# Patient Record
Sex: Female | Born: 1975
Health system: Southern US, Community
[De-identification: ages and names within clinical notes are randomized; demographics above are authoritative.]

## PROBLEM LIST (undated history)

## (undated) ENCOUNTER — Emergency Department (HOSPITAL_COMMUNITY): Admission: EM | Payer: BLUE CROSS/BLUE SHIELD

## (undated) DIAGNOSIS — I10 Essential (primary) hypertension: Secondary | ICD-10-CM

---

## 1999-08-29 ENCOUNTER — Inpatient Hospital Stay (HOSPITAL_COMMUNITY): Admission: AD | Admit: 1999-08-29 | Discharge: 1999-08-29 | Payer: Self-pay | Admitting: *Deleted

## 1999-10-02 ENCOUNTER — Inpatient Hospital Stay (HOSPITAL_COMMUNITY): Admission: AD | Admit: 1999-10-02 | Discharge: 1999-10-02 | Payer: Self-pay | Admitting: *Deleted

## 1999-10-03 ENCOUNTER — Encounter: Payer: Self-pay | Admitting: *Deleted

## 2009-10-13 ENCOUNTER — Emergency Department (HOSPITAL_COMMUNITY): Admission: EM | Admit: 2009-10-13 | Discharge: 2009-10-13 | Payer: Self-pay | Admitting: Emergency Medicine

## 2010-07-26 ENCOUNTER — Ambulatory Visit (INDEPENDENT_AMBULATORY_CARE_PROVIDER_SITE_OTHER): Payer: BC Managed Care – PPO

## 2010-07-26 ENCOUNTER — Encounter (HOSPITAL_COMMUNITY): Payer: Self-pay

## 2010-07-26 ENCOUNTER — Inpatient Hospital Stay (INDEPENDENT_AMBULATORY_CARE_PROVIDER_SITE_OTHER)
Admission: RE | Admit: 2010-07-26 | Discharge: 2010-07-26 | Disposition: A | Discharge status: Home / Self Care | Payer: BC Managed Care – PPO | Source: Ambulatory Visit | Attending: Emergency Medicine | Admitting: Emergency Medicine

## 2010-07-26 DIAGNOSIS — K5900 Constipation, unspecified: Secondary | ICD-10-CM

## 2010-07-26 LAB — POCT I-STAT, CHEM 8
BUN: 6 mg/dL (ref 6–23)
Calcium, Ion: 1.2 mmol/L (ref 1.12–1.32)
Chloride: 106 mEq/L (ref 96–112)
Creatinine, Ser: 0.7 mg/dL (ref 0.50–1.10)
Glucose, Bld: 92 mg/dL (ref 70–99)
HCT: 40 % (ref 36.0–46.0)
Hemoglobin: 13.6 g/dL (ref 12.0–15.0)
Potassium: 3.9 mEq/L (ref 3.5–5.1)
Sodium: 138 mEq/L (ref 135–145)
TCO2: 25 mmol/L (ref 0–100)

## 2010-07-26 LAB — POCT URINALYSIS DIP (DEVICE)
Glucose, UA: NEGATIVE mg/dL
Ketones, ur: NEGATIVE mg/dL
Leukocytes, UA: NEGATIVE
Nitrite: NEGATIVE
Protein, ur: NEGATIVE mg/dL
Specific Gravity, Urine: >=1.03 (ref 1.005–1.030)
Urobilinogen, UA: 1 mg/dL (ref 0.0–1.0)
pH: 5.5 (ref 5.0–8.0)

## 2010-07-26 LAB — OCCULT BLOOD, POC DEVICE: Fecal Occult Bld: NEGATIVE

## 2010-07-26 LAB — POCT PREGNANCY, URINE: Preg Test, Ur: NEGATIVE

## 2011-05-20 ENCOUNTER — Encounter: Payer: Self-pay | Admitting: *Deleted

## 2011-05-20 ENCOUNTER — Ambulatory Visit (INDEPENDENT_AMBULATORY_CARE_PROVIDER_SITE_OTHER): Payer: BC Managed Care – PPO | Admitting: Cardiovascular Disease

## 2011-05-20 VITALS — BP 143/87 | HR 62 | Ht 66.0 in | Wt 163.0 lb

## 2011-05-20 DIAGNOSIS — I493 Ventricular premature depolarization: Secondary | ICD-10-CM | POA: Insufficient documentation

## 2011-05-20 DIAGNOSIS — I4949 Other premature depolarization: Secondary | ICD-10-CM

## 2011-05-20 DIAGNOSIS — R011 Cardiac murmur, unspecified: Secondary | ICD-10-CM

## 2011-05-20 NOTE — Assessment & Plan Note (Signed)
Victoria Young is a healthy 36 year old female who was seen by her GYN doctor yesterday. She was noted to have a heart murmur. There is also question of ventricular arrhythmias.  [name] does have a normal systolic flow murmur. She does not have any significant diastolic murmur. She is normal splitting of the second heart sound.  I do not hear any arrhythmias on exam. She did have one premature ventricular contraction seen on the EKG.  The patient is completely asymptomatic. She exercises on an intermittent basis. She's able to run one or 2 miles without difficulty. I think that she is at low risk for upcoming GYN surgery.    I would be happy to see if she has any additional problems. I'll see her on an as-needed basis.

## 2011-05-20 NOTE — Progress Notes (Signed)
    Victoria Young Date of Birth  05/31/1975       [facility]    [place] [address]  [address] [place], [place]  [address]   [place], [place]  [address] [phone]     [phone]   Fax  [phone]    Fax [phone]  Problem List: 1. Heart murmur 2. Premature ventricular contractions  History of Present Illness:  Victoria Young is a 36 yo who is referred for evaulation of a heart murmur.  She's never had any significant episodes of chest pain. She has occasional episodes of dyspnea with exertion.  She still exercises occasionally. She's overrun about a mile and a half without any significant problems. She does notice some shortness of breath if she runs up the stairs very quickly.  She denies any syncope or presyncope.   she was at her OB/GYN doctor yesterday. She is scheduled have an outpatient procedure tomorrow. She was found to have a soft murmur and was referred here for further evaluation.  No current outpatient prescriptions on file prior to visit.    No Known Allergies  History reviewed. No pertinent past medical history.  Past Surgical History  Procedure Date  . Cesarean section     x2    History  Smoking status  . Never Smoker   Smokeless tobacco  . Not on file    History  Alcohol Use  . Yes    Occas.    Family History  Problem Relation Age of Onset  . Hypertension Mother   . Hypertension Father     Reviw of Systems:  Reviewed in the HPI.  All other systems are negative.  Physical Exam: Blood pressure 143/87, pulse 62, height 5\' 6"  (1.676 m), weight 163 lb (73.936 kg). General: Well developed, well nourished, in no acute distress.  Head: Normocephalic, atraumatic, sclera non-icteric, mucus membranes are moist,   Neck: Supple. Carotids are 2 + without bruits. No JVD  Lungs: Clear bilaterally to auscultation.  Heart: regular rate.  normal  S1 S2. She has a very soft systolic murmur at the LSB.   There is no significant diastolic murmur.  She has normal splitting of the S2.  Abdomen: Soft, non-tender, non-distended with normal bowel sounds. No hepatomegaly. No rebound/guarding. No masses.  Msk:  Strength and tone are normal  Extremities: No clubbing or cyanosis. No edema.  Distal pedal pulses are 2+ and equal bilaterally.  Neuro: Alert and oriented X 3. Moves all extremities spontaneously.  Psych:  Responds to questions appropriately with a normal affect.  ECG: May 20, 2011-sinus rhythm at 64 beats a minute. She has occasional premature ventricular contractions.  Assessment / Plan:

## 2011-05-20 NOTE — Patient Instructions (Signed)
Your physician recommends that you schedule a follow-up appointment in: PRN  PT AT LOW CARDIAC RISK FOR UPCOMING SURGERY

## 2012-02-21 ENCOUNTER — Emergency Department (HOSPITAL_COMMUNITY)
Admission: EM | Admit: 2012-02-21 | Discharge: 2012-02-21 | Disposition: A | Discharge status: Home / Self Care | Payer: BC Managed Care – PPO | Source: Home / Self Care | Attending: Emergency Medicine | Admitting: Emergency Medicine

## 2012-02-21 ENCOUNTER — Encounter (HOSPITAL_COMMUNITY): Payer: Self-pay | Admitting: Emergency Medicine

## 2012-02-21 ENCOUNTER — Other Ambulatory Visit: Payer: Self-pay

## 2012-02-21 DIAGNOSIS — F411 Generalized anxiety disorder: Secondary | ICD-10-CM

## 2012-02-21 DIAGNOSIS — F419 Anxiety disorder, unspecified: Secondary | ICD-10-CM

## 2012-02-21 DIAGNOSIS — I10 Essential (primary) hypertension: Secondary | ICD-10-CM

## 2012-02-21 HISTORY — DX: Essential (primary) hypertension: I10

## 2012-02-21 LAB — POCT PREGNANCY, URINE: Preg Test, Ur: NEGATIVE

## 2012-02-21 MED ORDER — HYDROCHLOROTHIAZIDE 12.5 MG PO TABS: 12.5000 mg | ORAL_TABLET | Freq: Every day | ORAL | Status: DC

## 2012-02-21 MED ORDER — CLONAZEPAM 0.5 MG PO TABS: 0.5000 mg | ORAL_TABLET | Freq: Two times a day (BID) | ORAL | Status: DC | PRN

## 2012-02-21 NOTE — ED Provider Notes (Signed)
Chief Complaint  Patient presents with  . Chest Pain    History of Present Illness:   Victoria Young is a 37 year old female who has had a two-week history of intermittent shortness of breath both with rest and exertion and a slight dry cough. She denies any wheezing or asthma history. Today she had some substernal chest pressure. This was not a pain and it did not radiate. She did not have any shortness of breath today and denies any diaphoresis or nausea. The past week she's had numbness of the right corner of the mouth it comes and goes and can last seconds at a time. She denies any other neurological symptoms. For about the past 2 weeks she's had daily headaches in the morning for which she takes daily Tylenol or Advil. Sometimes her heart races and skips. This is been going on for a long time. She saw a cardiologist at [facility] in [place] last summer and also a cardiologist at [facility], Dr. Eden Emms, both of them did EKGs and reassured her that everything looked okay. She had an echocardiogram in [place] which she was told was normal. She never had a stress test. Her only risk factor is high blood pressure which is controlled with diet. He denies any blurry vision, history of diabetes, other neurological symptoms, history of hyperlipidemia, or cigarette smoking. She states she's been stressed out by some weight gain even though she was trying to lose. She denies any depression. She states she does feel somewhat anxious.  Review of Systems:  Other than noted above, the patient denies any of the following symptoms. Systemic:  No fever, chills, sweats, or fatigue. ENT:  No nasal congestion, rhinorrhea, or sore throat. Pulmonary:  No cough, wheezing, shortness of breath, sputum production, hemoptysis. Cardiac:  No palpitations, rapid heartbeat, dizziness, presyncope or syncope. GI:  No abdominal pain, heartburn, nausea, or vomiting. Ext:  No leg pain or swelling.  PMFSH:  Past  medical history, family history, social history, meds, and allergies were reviewed and updated as needed.   Physical Exam:   Vital signs:  BP 144/102  Pulse 75  Temp(Src) 97.9 F (36.6 C) (Oral)  Resp 18  SpO2 100% Gen:  Alert, oriented, in no distress, skin warm and dry. Eye:  PERRL, lids and conjunctivas normal.  Sclera non-icteric. ENT:  Mucous membranes moist, pharynx clear. Neck:  Supple, no adenopathy or tenderness.  No JVD. Lungs:  Clear to auscultation, no wheezes, rales or rhonchi.  No respiratory distress. Heart:  Regular rhythm.  No gallops, murmers, clicks or rubs. Chest:  No chest wall tenderness. Abdomen:  Soft, nontender, no organomegaly or mass.  Bowel sounds normal.  No pulsatile abdominal mass or bruit. Ext:  No edema.  No calf tenderness and Homann's sign negative.  Pulses full and equal. Skin:  Warm and dry.  No rash.  Labs:   Results for orders placed during the hospital encounter of 02/21/12  POCT PREGNANCY, URINE      Result Value Range   Preg Test, Ur NEGATIVE  NEGATIVE     Radiology:  No results found. I reviewed the images independently and personally and concur with the radiologist's findings.  EKG:   Date: 02/21/2012  Rate: 64  Rhythm: normal sinus rhythm  QRS Axis: normal  Intervals: normal  ST/T Wave abnormalities: normal  Conduction Disutrbances:none  Narrative Interpretation: Normal sinus rhythm, left ventricular hypertrophy, otherwise normal EKG.  Old EKG Reviewed: none available  Assessment:  The  primary encounter diagnosis was Anxiety. A diagnosis of Hypertension was also pertinent to this visit.  I do not see any evidence of a cardiac cause for her symptoms. Shortness of breath, chest pressure, perioral numbness, headache, and palpitations all tender point towards anxiety. For right now would try clonazepam. Since her blood pressure was elevated today I went ahead and started on HydroDIURIL. I urged her to followup with her primary care  physician within the next week.  Plan:   1.  The following meds were prescribed:   Discharge Medication List as of 02/21/2012  1:32 PM    START taking these medications   Details  clonazePAM (KLONOPIN) 0.5 MG tablet Take 1 tablet (0.5 mg total) by mouth 2 (two) times daily as needed for anxiety., Starting 02/21/2012, Until Discontinued, Print    hydrochlorothiazide (HYDRODIURIL) 12.5 MG tablet Take 1 tablet (12.5 mg total) by mouth daily., Starting 02/21/2012, Until Discontinued, Normal       2.  The patient was instructed in symptomatic care and handouts were given. 3.  The patient was told to return if becoming worse in any way, if no better in 3 or 4 days, and given some red flag symptoms that would indicate earlier return.    Reuben Likes, MD 02/21/12 2129

## 2012-02-21 NOTE — ED Notes (Signed)
Dr. Lorenz Coaster has been adv of abd vitals and sx.

## 2012-02-21 NOTE — ED Notes (Signed)
Pt is here for chest pain since 0900 Sx include: severe headaches, numbness of right side of face that lasts for about 10 sec, SOB Denies: f/v/n/d/blurry vision, edema Hx of HTN; has not been taking her BP meds; controlling it w/diet and exercise BP today was 144/102  She is alert and talking in complete sentences w/no signs of acute distress.

## 2012-03-24 ENCOUNTER — Emergency Department (HOSPITAL_COMMUNITY)
Admission: EM | Admit: 2012-03-24 | Discharge: 2012-03-24 | Disposition: A | Discharge status: Home / Self Care | Payer: BC Managed Care – PPO | Attending: Emergency Medicine | Admitting: Emergency Medicine

## 2012-03-24 ENCOUNTER — Encounter (HOSPITAL_COMMUNITY): Payer: Self-pay | Admitting: Emergency Medicine

## 2012-03-24 DIAGNOSIS — R0789 Other chest pain: Secondary | ICD-10-CM | POA: Insufficient documentation

## 2012-03-24 DIAGNOSIS — R350 Frequency of micturition: Secondary | ICD-10-CM | POA: Insufficient documentation

## 2012-03-24 DIAGNOSIS — Z79899 Other long term (current) drug therapy: Secondary | ICD-10-CM | POA: Insufficient documentation

## 2012-03-24 DIAGNOSIS — I10 Essential (primary) hypertension: Secondary | ICD-10-CM | POA: Insufficient documentation

## 2012-03-24 DIAGNOSIS — R0602 Shortness of breath: Secondary | ICD-10-CM | POA: Insufficient documentation

## 2012-03-24 DIAGNOSIS — R079 Chest pain, unspecified: Secondary | ICD-10-CM

## 2012-03-24 LAB — CBC
HCT: 32.9 % — ABNORMAL LOW (ref 36.0–46.0)
Hemoglobin: 10.7 g/dL — ABNORMAL LOW (ref 12.0–15.0)
MCHC: 32.5 g/dL (ref 30.0–36.0)
MCV: 85.5 fL (ref 78.0–100.0)

## 2012-03-24 LAB — BASIC METABOLIC PANEL
BUN: 12 mg/dL (ref 6–23)
CO2: 24 mEq/L (ref 19–32)
Calcium: 9.1 mg/dL (ref 8.4–10.5)
Chloride: 101 mEq/L (ref 96–112)
Creatinine, Ser: 0.87 mg/dL (ref 0.50–1.10)
GFR calc Af Amer: >90 mL/min (ref >90)
GFR calc non Af Amer: 83 mL/min — ABNORMAL LOW (ref >90)
Glucose, Bld: 85 mg/dL (ref 70–99)
Potassium: 3.5 mEq/L (ref 3.5–5.1)
Sodium: 133 mEq/L — ABNORMAL LOW (ref 135–145)

## 2012-03-24 LAB — POCT I-STAT TROPONIN I
Troponin i, poc: 0.01 ng/mL (ref 0.00–0.08)
Troponin i, poc: 0.01 ng/mL (ref 0.00–0.08)

## 2012-03-24 MED ORDER — GI COCKTAIL ~~LOC~~
30.0000 mL | Freq: Once | ORAL | Status: AC
  Administered 2012-03-24: 30 mL via ORAL
  Filled 2012-03-24: qty 30

## 2012-03-24 MED ORDER — NITROGLYCERIN 0.4 MG SL SUBL
0.4000 mg | SUBLINGUAL_TABLET | SUBLINGUAL | Status: DC | PRN
  Administered 2012-03-24: 0.4 mg via SUBLINGUAL
  Filled 2012-03-24: qty 25

## 2012-03-24 NOTE — ED Notes (Signed)
EKG given to EDP, Miller,MD. 

## 2012-03-24 NOTE — ED Provider Notes (Signed)
History     CSN: [id number]  Arrival date & time 03/24/12  1749   First MD Initiated Contact with Patient 03/24/12 1803      Chief Complaint  Patient presents with  . Chest Pain    (Consider location/radiation/quality/duration/timing/severity/associated sxs/prior treatment) HPI Comments: 37 year old female with a history of recently diagnosed hypertension who presents to emergency department with chest pain. She states that 40 minutes ago while she was driving her car she developed acute onset of a sharp chest pain which then became a pressure in her chest. This has been persistent for the last 40 minutes, nothing seems to make it better or worse, it does not radiate, is not make her feel short of breath and has no associated nausea or vomiting. She states that she has never had any symptoms like this in the past, she has been taking hydrochlorothiazide for blood pressure and has had some increased urinary frequency but has not had any chest pain. She did not followup with a family doctors after her initial visit one month ago for similar symptoms. She has seen a cardiologist in the past who performed an echocardiogram per the medical record but did not show any significant abnormalities, she has never had a stress test or heart catheterization. She is not a diabetic, she does not smoke cigarettes, does not have high cholesterol and takes medication for her recently diagnosed high blood pressure. She did not have pain with exertion prior to the symptoms starting today, she has not noticed any specific activity which makes this better or worse today. It is not related to deep breathing and she has not had fevers chills coughing or shortness of breath.  Patient is a 37 y.o. female presenting with chest pain. The history is provided by the patient and medical records.  Chest Pain   Past Medical History  Diagnosis Date  . Hypertension     Past Surgical History  Procedure Laterality Date  .  Cesarean section      x2    Family History  Problem Relation Age of Onset  . Hypertension Mother   . Hypertension Father     History  Substance Use Topics  . Smoking status: Never Smoker   . Smokeless tobacco: Not on file  . Alcohol Use: Yes     Comment: Occas.    OB History   Grav Para Term Preterm Abortions TAB SAB Ect Mult Living                  Review of Systems  Cardiovascular: Positive for chest pain.  All other systems reviewed and are negative.    Allergies  Review of patient's allergies indicates no known allergies.  Home Medications   Current Outpatient Rx  Name  Route  Sig  Dispense  Refill  . hydrochlorothiazide (HYDRODIURIL) 12.5 MG tablet   Oral   Take 1 tablet (12.5 mg total) by mouth daily.   30 tablet   1     BP 135/83  Pulse 73  Temp(Src) 98.8 F (37.1 C) (Oral)  Resp 22  SpO2 98%  LMP 03/02/2012  Physical Exam  Nursing note and vitals reviewed. Constitutional: She appears well-developed and well-nourished. No distress.  HENT:  Head: Normocephalic and atraumatic.  Mouth/Throat: Oropharynx is clear and moist. No oropharyngeal exudate.  Eyes: Conjunctivae and EOM are normal. Pupils are equal, round, and reactive to light. Right eye exhibits no discharge. Left eye exhibits no discharge. No scleral icterus.  Neck: Normal  range of motion. Neck supple. No JVD present. No thyromegaly present.  Cardiovascular: Normal rate, regular rhythm, normal heart sounds and intact distal pulses.  Exam reveals no gallop and no friction rub.   No murmur heard. Pulmonary/Chest: Effort normal and breath sounds normal. No respiratory distress. She has no wheezes. She has no rales.  Abdominal: Soft. Bowel sounds are normal. She exhibits no distension and no mass. There is no tenderness.  Musculoskeletal: Normal range of motion. She exhibits no edema and no tenderness.  Lymphadenopathy:    She has no cervical adenopathy.  Neurological: She is alert.  Coordination normal.  Skin: Skin is warm and dry. No rash noted. No erythema.  Psychiatric: She has a normal mood and affect. Her behavior is normal.    ED Course  Procedures (including critical care time)  Labs Reviewed  CBC - Abnormal; Notable for the following:    RBC 3.85 (*)    Hemoglobin 10.7 (*)    HCT 32.9 (*)    All other components within normal limits  BASIC METABOLIC PANEL - Abnormal; Notable for the following:    Sodium 133 (*)    GFR calc non Af Amer 83 (*)    All other components within normal limits  POCT I-STAT TROPONIN I  POCT I-STAT TROPONIN I   No results found.   1. Chest pain       MDM  The patient has mild hypertension but a normal EKG, normal heart sounds, normal lung sounds, soft abdomen and no peripheral edema or asymmetry of the legs.   ED ECG REPORT  I personally interpreted this EKG   Date: 03/24/2012   Rate: 70  Rhythm: normal sinus rhythm  QRS Axis: normal  Intervals: normal  ST/T Wave abnormalities: normal  Conduction Disutrbances:none  Narrative Interpretation: possible LVH  Old EKG Reviewed:  coompaared with 02/21/2012, no changes are seen  The patient will need a troponin as well as a three-hour repeat, she appears well and I doubt this is a cardiac source given the patient's history and exam with a normal EKG. She was diagnosed with some anxiety her prior visit, she denies any further anxiety type symptoms since that time.  ED ECG REPORT  I personally interpreted this EKG   Date: 03/24/2012 2147  Rate: 75  Rhythm: normal sinus rhythm  QRS Axis: normal  Intervals: normal  ST/T Wave abnormalities: normal  Conduction Disutrbances:none  Narrative Interpretation: possible LVH, unchnaged  Old EKG Reviewed: unchanged  Second troponin negative, eval for improvemetn with GI cocktail shows.  The patient has had persistent mild chest pains through her entire visit. She is said to totally normal EKGs, to totally normal troponins and  has had no improvement with either nitroglycerin or a GI cocktail according to what she tells me. She has no significant hypertension, normal pulses at the bilateral radial arteries and appears very well. She is requesting discharge, states that she was in the hospital for admission for further testing but is agreeable to outpatient management and evaluation at the cardiologist's office who is followup information has been given to the patient.     Vida Roller, MD 03/24/12 2221

## 2012-03-24 NOTE — ED Notes (Signed)
Pt presenting to ed with c/o chest pain while driving pt states positive nausea no vomiting pt denies shortness of breath at this time

## 2012-03-24 NOTE — ED Notes (Signed)
EKG shown to Dr. Miller. 

## 2014-12-08 ENCOUNTER — Emergency Department (HOSPITAL_COMMUNITY)
Admission: EM | Admit: 2014-12-08 | Discharge: 2014-12-09 | Disposition: A | Discharge status: Home / Self Care | Payer: BLUE CROSS/BLUE SHIELD | Attending: Emergency Medicine | Admitting: Emergency Medicine

## 2014-12-08 ENCOUNTER — Encounter (HOSPITAL_COMMUNITY): Payer: Self-pay | Admitting: *Deleted

## 2014-12-08 ENCOUNTER — Emergency Department (HOSPITAL_COMMUNITY): Payer: BLUE CROSS/BLUE SHIELD

## 2014-12-08 DIAGNOSIS — I10 Essential (primary) hypertension: Secondary | ICD-10-CM | POA: Diagnosis not present

## 2014-12-08 DIAGNOSIS — R110 Nausea: Secondary | ICD-10-CM | POA: Diagnosis not present

## 2014-12-08 DIAGNOSIS — R079 Chest pain, unspecified: Secondary | ICD-10-CM | POA: Insufficient documentation

## 2014-12-08 DIAGNOSIS — R0602 Shortness of breath: Secondary | ICD-10-CM | POA: Insufficient documentation

## 2014-12-08 LAB — CBC
HCT: 37.4 % (ref 36.0–46.0)
HEMOGLOBIN: 12 g/dL (ref 12.0–15.0)
MCH: 27.8 pg (ref 26.0–34.0)
MCHC: 32.1 g/dL (ref 30.0–36.0)
MCV: 86.6 fL (ref 78.0–100.0)
PLATELETS: 247 10*3/uL (ref 150–400)
RBC: 4.32 MIL/uL (ref 3.87–5.11)
RDW: 13.3 % (ref 11.5–15.5)
WBC: 4.5 10*3/uL (ref 4.0–10.5)

## 2014-12-08 LAB — BASIC METABOLIC PANEL
ANION GAP: 5 (ref 5–15)
BUN: 5 mg/dL — ABNORMAL LOW (ref 6–20)
CALCIUM: 8.4 mg/dL — AB (ref 8.9–10.3)
CO2: 22 mmol/L (ref 22–32)
CREATININE: 0.68 mg/dL (ref 0.44–1.00)
Chloride: 107 mmol/L (ref 101–111)
GLUCOSE: 91 mg/dL (ref 65–99)
Potassium: 3.3 mmol/L — ABNORMAL LOW (ref 3.5–5.1)
Sodium: 134 mmol/L — ABNORMAL LOW (ref 135–145)

## 2014-12-08 LAB — TROPONIN I

## 2014-12-08 MED ORDER — HYDROCHLOROTHIAZIDE 12.5 MG PO CAPS
12.5000 mg | ORAL_CAPSULE | Freq: Every day | ORAL | Status: DC
  Administered 2014-12-09: 12.5 mg via ORAL
  Filled 2014-12-08: qty 1

## 2014-12-08 NOTE — ED Notes (Signed)
The pt is c/o chest tightness since yesterday  She has been lying down all day.  She has had some cold symptoms for about one week.  She stopped taking bp med on the advise of the dioctor.  lmp lmp now

## 2014-12-09 LAB — HEPATIC FUNCTION PANEL
ALBUMIN: 3.5 g/dL (ref 3.5–5.0)
ALK PHOS: 34 U/L — AB (ref 38–126)
ALT: 18 U/L (ref 14–54)
AST: 31 U/L (ref 15–41)
BILIRUBIN TOTAL: 0.5 mg/dL (ref 0.3–1.2)
Bilirubin, Direct: <0.1 mg/dL — ABNORMAL LOW (ref 0.1–0.5)
TOTAL PROTEIN: 7.1 g/dL (ref 6.5–8.1)

## 2014-12-09 LAB — LIPASE, BLOOD: LIPASE: 31 U/L (ref 11–51)

## 2014-12-09 LAB — BRAIN NATRIURETIC PEPTIDE: B Natriuretic Peptide: 55 pg/mL (ref 0.0–100.0)

## 2014-12-09 MED ORDER — HYDROCHLOROTHIAZIDE 12.5 MG PO TABS: 12.5000 mg | ORAL_TABLET | Freq: Every day | ORAL | Status: DC

## 2014-12-09 MED ORDER — GI COCKTAIL ~~LOC~~
30.0000 mL | Freq: Once | ORAL | Status: AC
  Administered 2014-12-09: 30 mL via ORAL
  Filled 2014-12-09: qty 30

## 2014-12-09 MED ORDER — KETOROLAC TROMETHAMINE 30 MG/ML IJ SOLN
30.0000 mg | Freq: Once | INTRAMUSCULAR | Status: AC
  Administered 2014-12-09: 30 mg via INTRAVENOUS
  Filled 2014-12-09: qty 1

## 2014-12-09 MED ORDER — PANTOPRAZOLE SODIUM 20 MG PO TBEC: 20.0000 mg | DELAYED_RELEASE_TABLET | Freq: Every day | ORAL | Status: DC

## 2014-12-09 MED ORDER — METOCLOPRAMIDE HCL 5 MG/ML IJ SOLN
10.0000 mg | INTRAMUSCULAR | Status: AC
  Administered 2014-12-09: 10 mg via INTRAVENOUS
  Filled 2014-12-09: qty 2

## 2014-12-09 NOTE — ED Notes (Signed)
Pt A&OX4, ambulatory with steady gait, NAD and states she has all of her belongings with her at d.c.

## 2014-12-09 NOTE — Discharge Instructions (Signed)
Resume taking your blood pressure medication until you are able to follow-up with your primary doctor. You may take ibuprofen for persistent pain. It is also possible that your symptoms may be due to indigestion or reflux. For this reason, take Protonix daily as prescribed. Return to the emergency department as needed if symptoms worsen.  Nonspecific Chest Pain  Chest pain can be caused by many different conditions. There is always a chance that your pain could be related to something serious, such as a heart attack or a blood clot in your lungs. Chest pain can also be caused by conditions that are not life-threatening. If you have chest pain, it is very important to follow up with your health care provider. CAUSES  Chest pain can be caused by:  Heartburn.  Pneumonia or bronchitis.  Anxiety or stress.  Inflammation around your heart (pericarditis) or lung (pleuritis or pleurisy).  A blood clot in your lung.  A collapsed lung (pneumothorax). It can develop suddenly on its own (spontaneous pneumothorax) or from trauma to the chest.  Shingles infection (varicella-zoster virus).  Heart attack.  Damage to the bones, muscles, and cartilage that make up your chest wall. This can include:  Bruised bones due to injury.  Strained muscles or cartilage due to frequent or repeated coughing or overwork.  Fracture to one or more ribs.  Sore cartilage due to inflammation (costochondritis). RISK FACTORS  Risk factors for chest pain may include:  Activities that increase your risk for trauma or injury to your chest.  Respiratory infections or conditions that cause frequent coughing.  Medical conditions or overeating that can cause heartburn.  Heart disease or family history of heart disease.  Conditions or health behaviors that increase your risk of developing a blood clot.  Having had chicken pox (varicella zoster). SIGNS AND SYMPTOMS Chest pain can feel like:  Burning or tingling on  the surface of your chest or deep in your chest.  Crushing, pressure, aching, or squeezing pain.  Dull or sharp pain that is worse when you move, cough, or take a deep breath.  Pain that is also felt in your back, neck, shoulder, or arm, or pain that spreads to any of these areas. Your chest pain may come and go, or it may stay constant. DIAGNOSIS Lab tests or other studies may be needed to find the cause of your pain. Your health care provider may have you take a test called an ambulatory ECG (electrocardiogram). An ECG records your heartbeat patterns at the time the test is performed. You may also have other tests, such as:  Transthoracic echocardiogram (TTE). During echocardiography, sound waves are used to create a picture of all of the heart structures and to look at how blood flows through your heart.  Transesophageal echocardiogram (TEE).This is a more advanced imaging test that obtains images from inside your body. It allows your health care provider to see your heart in finer detail.  Cardiac monitoring. This allows your health care provider to monitor your heart rate and rhythm in real time.  Holter monitor. This is a portable device that records your heartbeat and can help to diagnose abnormal heartbeats. It allows your health care provider to track your heart activity for several days, if needed.  Stress tests. These can be done through exercise or by taking medicine that makes your heart beat more quickly.  Blood tests.  Imaging tests. TREATMENT  Your treatment depends on what is causing your chest pain. Treatment may include:  Medicines.  These may include:  Acid blockers for heartburn.  Anti-inflammatory medicine.  Pain medicine for inflammatory conditions.  Antibiotic medicine, if an infection is present.  Medicines to dissolve blood clots.  Medicines to treat coronary artery disease.  Supportive care for conditions that do not require medicines. This may  include:  Resting.  Applying heat or cold packs to injured areas.  Limiting activities until pain decreases. HOME CARE INSTRUCTIONS  If you were prescribed an antibiotic medicine, finish it all even if you start to feel better.  Avoid any activities that bring on chest pain.  Do not use any tobacco products, including cigarettes, chewing tobacco, or electronic cigarettes. If you need help quitting, ask your health care provider.  Do not drink alcohol.  Take medicines only as directed by your health care provider.  Keep all follow-up visits as directed by your health care provider. This is important. This includes any further testing if your chest pain does not go away.  If heartburn is the cause for your chest pain, you may be told to keep your head raised (elevated) while sleeping. This reduces the chance that acid will go from your stomach into your esophagus.  Make lifestyle changes as directed by your health care provider. These may include:  Getting regular exercise. Ask your health care provider to suggest some activities that are safe for you.  Eating a heart-healthy diet. A registered dietitian can help you to learn healthy eating options.  Maintaining a healthy weight.  Managing diabetes, if necessary.  Reducing stress. SEEK MEDICAL CARE IF:  Your chest pain does not go away after treatment.  You have a rash with blisters on your chest.  You have a fever. SEEK IMMEDIATE MEDICAL CARE IF:   Your chest pain is worse.  You have an increasing cough, or you cough up blood.  You have severe abdominal pain.  You have severe weakness.  You faint.  You have chills.  You have sudden, unexplained chest discomfort.  You have sudden, unexplained discomfort in your arms, back, neck, or jaw.  You have shortness of breath at any time.  You suddenly start to sweat, or your skin gets clammy.  You feel nauseous or you vomit.  You suddenly feel light-headed or  dizzy.  Your heart begins to beat quickly, or it feels like it is skipping beats. These symptoms may represent a serious problem that is an emergency. Do not wait to see if the symptoms will go away. Get medical help right away. Call your local emergency services (911 in the [place] Do not drive yourself to the hospital.   This information is not intended to replace advice given to you by your health care provider. Make sure you discuss any questions you have with your health care provider.   Document Released: 10/07/2004 Document Revised: 01/18/2014 Document Reviewed: 08/03/2013 Elsevier Interactive Patient Education [employer].

## 2014-12-09 NOTE — ED Provider Notes (Signed)
Medical screening examination/treatment/procedure(s) were conducted as a shared visit with non-physician practitioner(s) and myself.  I personally evaluated the patient during the encounter.  Chest pain, vomiting, seems to be GI related. Exam benign with normal VS, cardiac with rrr no m/r/g, lungs ctab without wheezing or rales. Plan to treat symptomatically and rule out emergent causes and likely discharge if improved.    EKG Interpretation   Date/Time:  Sunday December 08 2014 22:59:51 EST Ventricular Rate:  76 PR Interval:  160 QRS Duration: 90 QT Interval:  420 QTC Calculation: 472 R Axis:   79 Text Interpretation:  Normal sinus rhythm Minimal voltage criteria for  LVH, may be normal variant Borderline ECG Confirmed by [facility] MD, Julietta Batterman  [phone]([id number]) on 12/09/2014 12:29:50 AM        Victoria MemosJason Mayur Duman, MD 12/10/14 1234

## 2014-12-09 NOTE — ED Provider Notes (Signed)
CSN: [id number]     Arrival date & time 12/08/14  2252 History   First MD Initiated Contact with Patient 12/08/14 2348     Chief Complaint  Patient presents with  . Chest Pain     (Consider location/radiation/quality/duration/timing/severity/associated sxs/prior Treatment) HPI Comments: 39 year old female with a history of hypertension presents to the emergency department for further evaluation of chest pain. Patient describes a central throbbing chest pain which has been constant and waxing and waning in severity since 1 PM today, after arriving home from church. She denies radiation of the pain and feels like she catches her breath when the pain worsens. Patient also reports associated nausea. Patient states that she has tried resting without relief. She reports having some cold symptoms for approximately one week. She has also been off of her blood pressure medication for the past few weeks as her primary care doctor told her to discontinue it. Patient was previously on hydrochlorothiazide 12.5 mg. Patient denies a hx of DM, HLD, PE/DVT. No known FHx of ACS, DVT/PE.  Patient is a 39 y.o. female presenting with chest pain. The history is provided by the patient. No language interpreter was used.  Chest Pain Associated symptoms: nausea and shortness of breath (with worsening pain)   Associated symptoms: no fever, no numbness, not vomiting and no weakness      Past Medical History  Diagnosis Date  . Hypertension    Past Surgical History  Procedure Laterality Date  . Cesarean section      x2   Family History  Problem Relation Age of Onset  . Hypertension Mother   . Hypertension Father    Social History  Substance Use Topics  . Smoking status: Never Smoker   . Smokeless tobacco: None  . Alcohol Use: Yes     Comment: Occas.   OB History    No data available      Review of Systems  Constitutional: Negative for fever.  Respiratory: Positive for chest tightness and shortness  of breath (with worsening pain).   Cardiovascular: Positive for chest pain.  Gastrointestinal: Positive for nausea. Negative for vomiting.  Neurological: Negative for syncope, weakness and numbness.  All other systems reviewed and are negative.   Allergies  Review of patient's allergies indicates no known allergies.  Home Medications   Prior to Admission medications   Medication Sig Start Date End Date Taking? Authorizing Provider  hydrochlorothiazide (HYDRODIURIL) 12.5 MG tablet Take 1 tablet (12.5 mg total) by mouth daily. 02/21/12   Reuben Likes, MD   BP 152/126 mmHg  Pulse 76  Temp(Src) 98.4 F (36.9 C)  Resp 20  Ht  (1.676 m)  Wt 78.019 kg  BMI 27.77 kg/m2  SpO2 99%  LMP 12/08/2014   Physical Exam  Constitutional: She is oriented to person, place, and time. She appears well-developed and well-nourished. No distress.  Nontoxic/nonseptic appearing  HENT:  Head: Normocephalic and atraumatic.  Eyes: Conjunctivae and EOM are normal. No scleral icterus.  Neck: Normal range of motion.  Cardiovascular: Normal rate, regular rhythm and intact distal pulses.   Pulmonary/Chest: Effort normal and breath sounds normal. No respiratory distress. She has no wheezes. She has no rales.  Respirations even and unlabored. Lungs clear.  Abdominal: Soft. She exhibits no distension. There is no tenderness. There is no rebound.  Soft, nontender abdomen  Musculoskeletal: Normal range of motion.  Neurological: She is alert and oriented to person, place, and time. She exhibits normal muscle tone. Coordination normal.  GCS 15. Speech is goal oriented. Patient moving all extremities.  Skin: Skin is warm and dry. No rash noted. She is not diaphoretic. No erythema. No pallor.  Psychiatric: She has a normal mood and affect. Her behavior is normal.  Nursing note and vitals reviewed.   ED Course  Procedures (including critical care time) Labs Review Labs Reviewed  BASIC METABOLIC PANEL -  Abnormal; Notable for the following:    Sodium 134 (*)    Potassium 3.3 (*)    BUN 5 (*)    Calcium 8.4 (*)    All other components within normal limits  CBC  TROPONIN I  BRAIN NATRIURETIC PEPTIDE  HEPATIC FUNCTION PANEL  LIPASE, BLOOD    Imaging Review Dg Chest 2 View  12/09/2014  CLINICAL DATA:  Chest tightness of 1 day duration. EXAM: CHEST  2 VIEW COMPARISON:  None. FINDINGS: There is moderate cardiomegaly. The lungs are clear. There is no pleural effusion. Pulmonary vasculature is normal. Hilar, mediastinal contours are unremarkable. IMPRESSION: Cardiomegaly. Electronically Signed   By: Ellery Plunkaniel R Mitchell M.D.   On: 12/09/2014 00:05   I have personally reviewed and evaluated these images and lab results as part of my medical decision-making.   EKG Interpretation   Date/Time:  Sunday December 08 2014 22:59:51 EST Ventricular Rate:  76 PR Interval:  160 QRS Duration: 90 QT Interval:  420 QTC Calculation: 472 R Axis:   79 Text Interpretation:  Normal sinus rhythm Minimal voltage criteria for  LVH, may be normal variant Borderline ECG Confirmed by MESNER MD, Barbara CowerJASON  [phone]([id number]) on 12/09/2014 12:29:50 AM      MDM   Final diagnoses:  Chest pain, unspecified chest pain type  Essential hypertension    39 year old female presents to the emergency department for further evaluation of chest pain. Chest pain has been constant and waxing and waning since 1300. Patient has had a reassuring cardiac workup. She has a negative troponin and nonischemic EKG. Heart score is 2 consistent with low risk of acute coronary event. I have a low suspicion for ACS in this patient. Chest x-ray shows no evidence of pneumothorax, pneumonia, or pleural effusion. Patient noted to have cardiomegaly which appears stable compared to x-ray in 2012. Further doubt dissection. Abdominal etiologies such as cholecystitis and pancreatitis considered; however, patient has a normal lipase and normal LFTs. She has no  leukocytosis to suggest infection. Patient is also without focal abdominal tenderness on exam.  Patient has had improvement in her chest pain after receiving Toradol. Patient had one episode of emesis which also improved her symptoms prior to receiving Toradol. She states that she is feeling much better and desires discharge. Question indigestion or gastritis. Patient given GI cocktail and will start her on a course of pantoprazole. Patient has also been restarted on her blood pressure medication as she has been hypertensive while in the emergency department. She has been instructed to follow-up with her primary doctor within the week for blood pressure recheck and further evaluation of her symptoms. Return precautions discussed and provided. Patient discharged in good condition with no unaddressed concerns.    Antony MaduraKelly Cloys Vera, PA-C 12/09/14 [phone]  Marily MemosJason Mesner, MD 12/10/14 [phone]

## 2015-04-01 DIAGNOSIS — S83412A Sprain of medial collateral ligament of left knee, initial encounter: Secondary | ICD-10-CM | POA: Insufficient documentation

## 2015-05-06 ENCOUNTER — Encounter (HOSPITAL_BASED_OUTPATIENT_CLINIC_OR_DEPARTMENT_OTHER): Payer: Self-pay | Admitting: *Deleted

## 2015-05-06 ENCOUNTER — Emergency Department (HOSPITAL_BASED_OUTPATIENT_CLINIC_OR_DEPARTMENT_OTHER)
Admission: EM | Admit: 2015-05-06 | Discharge: 2015-05-06 | Disposition: A | Discharge status: Home / Self Care | Payer: Managed Care, Other (non HMO) | Attending: Emergency Medicine | Admitting: Emergency Medicine

## 2015-05-06 DIAGNOSIS — S40861A Insect bite (nonvenomous) of right upper arm, initial encounter: Secondary | ICD-10-CM | POA: Insufficient documentation

## 2015-05-06 DIAGNOSIS — Y929 Unspecified place or not applicable: Secondary | ICD-10-CM | POA: Diagnosis not present

## 2015-05-06 DIAGNOSIS — Y939 Activity, unspecified: Secondary | ICD-10-CM | POA: Insufficient documentation

## 2015-05-06 DIAGNOSIS — Y999 Unspecified external cause status: Secondary | ICD-10-CM | POA: Diagnosis not present

## 2015-05-06 DIAGNOSIS — I10 Essential (primary) hypertension: Secondary | ICD-10-CM | POA: Insufficient documentation

## 2015-05-06 DIAGNOSIS — W57XXXA Bitten or stung by nonvenomous insect and other nonvenomous arthropods, initial encounter: Secondary | ICD-10-CM | POA: Insufficient documentation

## 2015-05-06 DIAGNOSIS — S0086XA Insect bite (nonvenomous) of other part of head, initial encounter: Secondary | ICD-10-CM | POA: Diagnosis present

## 2015-05-06 MED ORDER — HYDROCORTISONE 2.5 % EX LOTN: TOPICAL_LOTION | Freq: Two times a day (BID) | CUTANEOUS | Status: DC

## 2015-05-06 NOTE — ED Provider Notes (Signed)
CSN: [id number]     Arrival date & time 05/06/15  1835 History   First MD Initiated Contact with Patient 05/06/15 1943     Chief Complaint  Patient presents with  . Insect Bite     (Consider location/radiation/quality/duration/timing/severity/associated sxs/prior Treatment) The history is provided by the patient and medical records. No language interpreter was used.     Victoria Young is a 40 y.o. female  with a hx of HTN presents to the Emergency Department complaining of intermittent swelling to multiple sites of the body onset 2 weeks ago. She reports each site has appeared individually, the first on her right arm, the second on her right hand and the third on her left jaw is noted today. She reports that she thought these might be that bug bites but has searched her mattress without finding any. She reports using permethrin without relief either. She denies any other lesions on her body, pain or increased warmth at the site. Patient reports that they do not really itch. She denies changes in soaps, detergent, makeup, perfume or other environmental factors. She denies new mattress, new clothes, new furniture. She denies pets in the home. She denies being outside or in the fluids. She denies known tick bites. She denies dental pain, jaw pain or trismus. She has fever, chills, nausea, vomiting, abdominal pain, chest pain, shortness of breath, difficulty swallowing or difficulty breathing.   Past Medical History  Diagnosis Date  . Hypertension    Past Surgical History  Procedure Laterality Date  . Cesarean section      x2   Family History  Problem Relation Age of Onset  . Hypertension Mother   . Hypertension Father    Social History  Substance Use Topics  . Smoking status: Never Smoker   . Smokeless tobacco: None  . Alcohol Use: Yes     Comment: Occas.   OB History    No data available     Review of Systems  Constitutional: Negative for fever, diaphoresis, appetite change,  fatigue and unexpected weight change.  HENT: Negative for mouth sores.   Eyes: Negative for visual disturbance.  Respiratory: Negative for cough, chest tightness, shortness of breath and wheezing.   Cardiovascular: Negative for chest pain.  Gastrointestinal: Negative for nausea, vomiting, abdominal pain, diarrhea and constipation.  Endocrine: Negative for polydipsia, polyphagia and polyuria.  Genitourinary: Negative for dysuria, urgency, frequency and hematuria.  Musculoskeletal: Negative for back pain and neck stiffness.  Skin: Negative for rash.       Erythematous area with swelling to the left jaw  Allergic/Immunologic: Negative for immunocompromised state.  Neurological: Negative for syncope, light-headedness and headaches.  Hematological: Does not bruise/bleed easily.  Psychiatric/Behavioral: Negative for sleep disturbance. The patient is not nervous/anxious.       Allergies  Review of patient's allergies indicates no known allergies.  Home Medications   Prior to Admission medications   Medication Sig Start Date End Date Taking? Authorizing Provider  hydrochlorothiazide (HYDRODIURIL) 12.5 MG tablet Take 1 tablet (12.5 mg total) by mouth daily. 12/09/14   Antony MaduraKelly Humes, PA-C  hydrocortisone 2.5 % lotion Apply topically 2 (two) times daily. 05/06/15   Madlynn Lundeen, PA-C  pantoprazole (PROTONIX) 20 MG tablet Take 1 tablet (20 mg total) by mouth daily. 12/09/14   Antony MaduraKelly Humes, PA-C   BP 157/111 mmHg  Pulse 80  Temp(Src) 98.9 F (37.2 C) (Oral)  Resp 20  Ht 5\' 6"  (1.676 m)  Wt 81.647 kg  BMI 29.07 kg/m2  SpO2 100%  LMP 04/08/2015 Physical Exam  Constitutional: She is oriented to person, place, and time. She appears well-developed and well-nourished. No distress.  HENT:  Head: Normocephalic and atraumatic.  Right Ear: Tympanic membrane, external ear and ear canal normal.  Left Ear: Tympanic membrane, external ear and ear canal normal.  Nose: Nose normal. No mucosal  edema or rhinorrhea. Right sinus exhibits no maxillary sinus tenderness and no frontal sinus tenderness. Left sinus exhibits no maxillary sinus tenderness and no frontal sinus tenderness.  Mouth/Throat: Uvula is midline, oropharynx is clear and moist and mucous membranes are normal. No oral lesions. Abnormal dentition. Dental caries present. No uvula swelling or lacerations. No oropharyngeal exudate, posterior oropharyngeal edema, posterior oropharyngeal erythema or tonsillar abscesses.  No swelling of the uvula or oropharynx Teeth intact without dental caries No tenderness to palpation of the teeth No erythema or swelling of the gingiva Small 2 x 2 centimeter area of erythema and edema to the left external jaw without ulceration, lesion, petechiae or visible wound; no induration or increased warmth to the site; no fluctuant abscess  Eyes: Conjunctivae are normal. Pupils are equal, round, and reactive to light. Right eye exhibits no discharge. Left eye exhibits no discharge.  Neck: Normal range of motion. Neck supple.  Patent airway No stridor; normal phonation Handling secretions without difficulty  Cardiovascular: Normal rate, regular rhythm, normal heart sounds and intact distal pulses.   No murmur heard. Pulmonary/Chest: Effort normal and breath sounds normal. No stridor. No respiratory distress. She has no wheezes.  No wheezes or rhonchi  Abdominal: Soft. Bowel sounds are normal. She exhibits no distension. There is no tenderness.  Musculoskeletal: Normal range of motion. She exhibits no edema.  Lymphadenopathy:    She has no cervical adenopathy.  Neurological: She is alert and oriented to person, place, and time.  Skin: Skin is warm and dry. She is not diaphoretic. There is erythema.  Small area of erythema as described in HEENT  Psychiatric: She has a normal mood and affect.  Nursing note and vitals reviewed.   ED Course  Procedures (including critical care time)  MDM   Final  diagnoses:  Insect bite   Victoria Young presents with small area of erythema to the left lower jaw. No evidence of dental abscess or dental infection. Site is without induration, increased warmth or fluctuance. No evidence of cellulitis or abscess. No ulceration or evidence of a bite. No tenderness to palpation of the site. Unknown etiology. Question potential bedbugs. Patient given hydrocortisone and that both precautions given. No evidence of allergic reaction. No evidence of scabies.  No evidence of allergic reaction or anaphylaxis. She referred to dermatology for further evaluation.  Patient noted to be hypertensive in the emergency department.  No signs of hypertensive urgency. She reports she has not been taking her medication. Discussed with patient the need for close follow-up and management by their primary care physician in addition to medication compliance. Patient states understanding.    Dahlia Client Lynee Rosenbach, PA-C 05/06/15 2138  Rolland Porter, MD 05/17/15 2100

## 2015-05-06 NOTE — ED Notes (Signed)
C/o ? Insect bite to left jaw. Noticed small bump yesterday, today increased swelling and a knot on jaw, w slight itching,  Increased swelling,  Denies pain, no diff breathing or talking

## 2015-05-06 NOTE — ED Notes (Signed)
Insect bite to her left jaw. Swelling and itching.

## 2015-05-06 NOTE — Discharge Instructions (Signed)
1. Medications: hydrocortisone lotion, usual home medications 2. Treatment: rest, drink plenty of fluids, cool compresses, bag mattress and pillows, wash all bed linens on hot water 3. Follow Up: Please followup with dermatology in 3-5 days if symptoms persist for discussion of your diagnoses and further evaluation after today's visit; if you do not have a primary care doctor use the resource guide provided to find one; Please return to the ER for worsening symptoms, signs of infection  Bedbugs Bedbugs are tiny bugs that live in and around beds. During the day, they stay hidden. At night, they come out and bite. WHERE ARE BEDBUGS FOUND? Bedbugs can be found anywhere. It does not matter if a place is clean or dirty. They are often found in:  Hotels.  Shelters.  Dorms.  Hospitals.  Nursing homes.  Places where there are many birds or bats. WHAT ARE BEDBUG BITES LIKE? A bedbug bite leaves a small red bump with a darker red dot in the middle. The bump may show up soon after a person is bitten or a day or more later. Bedbug bites usually do not hurt, but they may itch. Most people do not need treatment for bedbug bites. The bumps usually go away on their own in a few days. HOW DO I CHECK FOR BEDBUGS? Bedbugs are reddish-brown, oval, and flat. They are very small and they cannot fly. Look for bedbugs in these places:  On mattresses, bed frames, headboards, and box springs.  On drapes and curtains in bedrooms.  Under the carpet in bedrooms.  Behind electrical outlets.  Behind any wallpaper that is peeling.  Inside luggage. Also look for black or red spots or stains on or near the bed. WHAT SHOULD I DO IF I FIND BEDBUGS? When Traveling Check your clothes, suitcase, and belongings for bedbugs before you go back home. You may want to throw away anything that has bedbugs on it. At Home Your bedroom may need to be treated by a pest control expert. You may also need to throw away  mattresses or luggage. To help keep bedbugs from coming back, you may want to:  Put a plastic cover over your mattress.  Wash your clothes and bedding in water that is hotter than 120F (48.9C). Dry them on a hot setting.  Vacuum often around the bed and in all of the cracks where the bugs might hide.  Check all used furniture, bedding, or clothes that you bring into your home.  Get rid of bird nests and bat roosts that are near your home. In Your Bed Try wearing pajamas that have long sleeves and pant legs. Bedbugs usually bite areas of the skin that are not covered.   This information is not intended to replace advice given to you by your health care provider. Make sure you discuss any questions you have with your health care provider.   Document Released: 04/14/2010 Document Revised: 05/14/2014 Document Reviewed: 12/24/2013 Elsevier Interactive Patient Education [employer]2016 Elsevier Inc.

## 2015-07-31 ENCOUNTER — Ambulatory Visit (HOSPITAL_COMMUNITY)
Admission: EM | Admit: 2015-07-31 | Discharge: 2015-07-31 | Disposition: A | Discharge status: Home / Self Care | Payer: Managed Care, Other (non HMO) | Attending: Emergency Medicine | Admitting: Emergency Medicine

## 2015-07-31 ENCOUNTER — Encounter (HOSPITAL_COMMUNITY): Payer: Self-pay | Admitting: Emergency Medicine

## 2015-07-31 DIAGNOSIS — M62838 Other muscle spasm: Secondary | ICD-10-CM

## 2015-07-31 DIAGNOSIS — M6249 Contracture of muscle, multiple sites: Secondary | ICD-10-CM

## 2015-07-31 MED ORDER — CYCLOBENZAPRINE HCL 10 MG PO TABS: 10.0000 mg | ORAL_TABLET | Freq: Two times a day (BID) | ORAL | Status: DC | PRN
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
## 2015-07-31 NOTE — Discharge Instructions (Signed)
Heat Therapy  Heat therapy can help ease sore, stiff, injured, and tight muscles and joints. Heat relaxes your muscles, which may help ease your pain.   RISKS AND COMPLICATIONS  If you have any of the following conditions, do not use heat therapy unless your health care provider has approved:   Poor circulation.   Healing wounds or scarred skin in the area being treated.   Diabetes, heart disease, or high blood pressure.   Not being able to feel (numbness) the area being treated.   Unusual swelling of the area being treated.   Active infections.   Blood clots.   Cancer.   Inability to communicate pain. This may include young children and people who have problems with their brain function (dementia).   Pregnancy.  Heat therapy should only be used on old, pre-existing, or long-lasting (chronic) injuries. Do not use heat therapy on new injuries unless directed by your health care provider.  HOW TO USE HEAT THERAPY  There are several different kinds of heat therapy, including:   Moist heat pack.   Warm water bath.   Hot water bottle.   Electric heating pad.   Heated gel pack.   Heated wrap.   Electric heating pad.  Use the heat therapy method suggested by your health care provider. Follow your health care provider's instructions on when and how to use heat therapy.  GENERAL HEAT THERAPY RECOMMENDATIONS   Do not sleep while using heat therapy. Only use heat therapy while you are awake.   Your skin may turn pink while using heat therapy. Do not use heat therapy if your skin turns red.   Do not use heat therapy if you have new pain.   High heat or long exposure to heat can cause burns. Be careful when using heat therapy to avoid burning your skin.   Do not use heat therapy on areas of your skin that are already irritated, such as with a rash or sunburn.  SEEK MEDICAL CARE IF:   You have blisters, redness, swelling, or numbness.   You have new pain.   Your pain is worse.  MAKE SURE  YOU:   Understand these instructions.   Will watch your condition.   Will get help right away if you are not doing well or get worse.     This information is not intended to replace advice given to you by your health care provider. Make sure you discuss any questions you have with your health care provider.     Document Released: 03/22/2011 Document Revised: 01/18/2014 Document Reviewed: 02/20/2013  Elsevier Interactive Patient Education 2016 Elsevier Inc.
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
## 2015-11-10 DIAGNOSIS — M722 Plantar fascial fibromatosis: Secondary | ICD-10-CM | POA: Insufficient documentation

## 2015-11-25 ENCOUNTER — Ambulatory Visit (HOSPITAL_COMMUNITY)
Admission: EM | Admit: 2015-11-25 | Discharge: 2015-11-25 | Discharge status: Absent without leave | Payer: BLUE CROSS/BLUE SHIELD

## 2015-11-25 ENCOUNTER — Encounter (HOSPITAL_COMMUNITY): Payer: Self-pay | Admitting: *Deleted

## 2015-11-25 ENCOUNTER — Inpatient Hospital Stay (HOSPITAL_COMMUNITY)
Admission: AD | Admit: 2015-11-25 | Discharge: 2015-11-25 | Disposition: A | Discharge status: Home / Self Care | Payer: Managed Care, Other (non HMO) | Source: Ambulatory Visit | Attending: Obstetrics & Gynecology | Admitting: Obstetrics & Gynecology

## 2015-11-25 DIAGNOSIS — Z3202 Encounter for pregnancy test, result negative: Secondary | ICD-10-CM | POA: Diagnosis not present

## 2015-11-25 DIAGNOSIS — I10 Essential (primary) hypertension: Secondary | ICD-10-CM | POA: Insufficient documentation

## 2015-11-25 DIAGNOSIS — N939 Abnormal uterine and vaginal bleeding, unspecified: Secondary | ICD-10-CM | POA: Diagnosis not present

## 2015-11-25 LAB — TYPE AND SCREEN
ABO/RH(D): A POS
ANTIBODY SCREEN: NEGATIVE

## 2015-11-25 LAB — CBC WITH DIFFERENTIAL/PLATELET
BASOS ABS: 0 10*3/uL (ref 0.0–0.1)
BASOS PCT: 0 %
Eosinophils Absolute: 0.1 10*3/uL (ref 0.0–0.7)
Eosinophils Relative: 1 %
HEMATOCRIT: 33.2 % — AB (ref 36.0–46.0)
HEMOGLOBIN: 10.9 g/dL — AB (ref 12.0–15.0)
LYMPHS ABS: 3 10*3/uL (ref 0.7–4.0)
LYMPHS PCT: 60 %
MCH: 28.2 pg (ref 26.0–34.0)
MCHC: 32.8 g/dL (ref 30.0–36.0)
MCV: 86 fL (ref 78.0–100.0)
MONOS PCT: 4 %
Monocytes Absolute: 0.2 10*3/uL (ref 0.1–1.0)
Neutro Abs: 1.8 10*3/uL (ref 1.7–7.7)
Neutrophils Relative %: 35 %
Platelets: 255 10*3/uL (ref 150–400)
RBC: 3.86 MIL/uL — AB (ref 3.87–5.11)
RDW: 13.6 % (ref 11.5–15.5)
WBC: 5.1 10*3/uL (ref 4.0–10.5)

## 2015-11-25 LAB — URINALYSIS, ROUTINE W REFLEX MICROSCOPIC
BILIRUBIN URINE: NEGATIVE
Glucose, UA: NEGATIVE mg/dL
HGB URINE DIPSTICK: NEGATIVE
KETONES UR: NEGATIVE mg/dL
Leukocytes, UA: NEGATIVE
NITRITE: NEGATIVE
PH: 6 (ref 5.0–8.0)
Protein, ur: NEGATIVE mg/dL

## 2015-11-25 LAB — POCT PREGNANCY, URINE: Preg Test, Ur: NEGATIVE

## 2015-11-25 LAB — HCG, QUANTITATIVE, PREGNANCY: hCG, Beta Chain, Quant, S: <1 m[IU]/mL (ref <5)

## 2015-11-25 MED ORDER — MEDROXYPROGESTERONE ACETATE 10 MG PO TABS: 10.0000 mg | ORAL_TABLET | Freq: Every day | ORAL | 0 refills | Status: DC

## 2015-11-25 NOTE — Discharge Instructions (Signed)
Abnormal Uterine Bleeding Abnormal uterine bleeding can affect women at various stages in life, including teenagers, women in their reproductive years, pregnant women, and women who have reached menopause. Several kinds of uterine bleeding are considered abnormal, including:  Bleeding or spotting between periods.  Bleeding after sexual intercourse.  Bleeding that is heavier or more than normal.  Periods that last longer than usual.  Bleeding after menopause. Many cases of abnormal uterine bleeding are minor and simple to treat, while others are more serious. Any type of abnormal bleeding should be evaluated by your health care provider. Treatment will depend on the cause of the bleeding. Follow these instructions at home: Monitor your condition for any changes. The following actions may help to alleviate any discomfort you are experiencing:  Avoid the use of tampons and douches as directed by your health care provider.  Change your pads frequently. You should get regular pelvic exams and Pap tests. Keep all follow-up appointments for diagnostic tests as directed by your health care provider. Contact a health care provider if:  Your bleeding lasts more than 1 week.  You feel dizzy at times. Get help right away if:  You pass out.  You are changing pads every 15 to 30 minutes.  You have abdominal pain.  You have a fever.  You become sweaty or weak.  You are passing large blood clots from the vagina.  You start to feel nauseous and vomit. This information is not intended to replace advice given to you by your health care provider. Make sure you discuss any questions you have with your health care provider. Document Released: 12/28/2004 Document Revised: 06/11/2015 Document Reviewed: 07/27/2012 Elsevier Interactive Patient Education  2017 [employer]  Contraception Choices Contraception (birth control) is the use of any methods or devices to prevent pregnancy. Below are  some methods to help avoid pregnancy. Hormonal methods  Contraceptive implant. This is a thin, plastic tube containing progesterone hormone. It does not contain estrogen hormone. Your health care provider inserts the tube in the inner part of the upper arm. The tube can remain in place for up to 3 years. After 3 years, the implant must be removed. The implant prevents the ovaries from releasing an egg (ovulation), thickens the cervical mucus to prevent sperm from entering the uterus, and thins the lining of the inside of the uterus.  Progesterone-only injections. These injections are given every 3 months by your health care provider to prevent pregnancy. This synthetic progesterone hormone stops the ovaries from releasing eggs. It also thickens cervical mucus and changes the uterine lining. This makes it harder for sperm to survive in the uterus.  Birth control pills. These pills contain estrogen and progesterone hormone. They work by preventing the ovaries from releasing eggs (ovulation). They also cause the cervical mucus to thicken, preventing the sperm from entering the uterus. Birth control pills are prescribed by a health care provider.Birth control pills can also be used to treat heavy periods.  Minipill. This type of birth control pill contains only the progesterone hormone. They are taken every day of each month and must be prescribed by your health care provider.  Birth control patch. The patch contains hormones similar to those in birth control pills. It must be changed once a week and is prescribed by a health care provider.  Vaginal ring. The ring contains hormones similar to those in birth control pills. It is left in the vagina for 3 weeks, removed for 1 week, and then a new  one is put back in place. The patient must be comfortable inserting and removing the ring from the vagina.A health care provider's prescription is necessary.  Emergency contraception. Emergency contraceptives  prevent pregnancy after unprotected sexual intercourse. This pill can be taken right after sex or up to 5 days after unprotected sex. It is most effective the sooner you take the pills after having sexual intercourse. Most emergency contraceptive pills are available without a prescription. Check with your pharmacist. Do not use emergency contraception as your only form of birth control. Barrier methods  Female condom. This is a thin sheath (latex or rubber) that is worn over the penis during sexual intercourse. It can be used with spermicide to increase effectiveness.  Female condom. This is a soft, loose-fitting sheath that is put into the vagina before sexual intercourse.  Diaphragm. This is a soft, latex, dome-shaped barrier that must be fitted by a health care provider. It is inserted into the vagina, along with a spermicidal jelly. It is inserted before intercourse. The diaphragm should be left in the vagina for 6 to 8 hours after intercourse.  Cervical cap. This is a round, soft, latex or plastic cup that fits over the cervix and must be fitted by a health care provider. The cap can be left in place for up to 48 hours after intercourse.  Sponge. This is a soft, circular piece of polyurethane foam. The sponge has spermicide in it. It is inserted into the vagina after wetting it and before sexual intercourse.  Spermicides. These are chemicals that kill or block sperm from entering the cervix and uterus. They come in the form of creams, jellies, suppositories, foam, or tablets. They do not require a prescription. They are inserted into the vagina with an applicator before having sexual intercourse. The process must be repeated every time you have sexual intercourse. Intrauterine contraception  Intrauterine device (IUD). This is a T-shaped device that is put in a woman's uterus during a menstrual period to prevent pregnancy. There are 2 types:  Copper IUD. This type of IUD is wrapped in copper wire  and is placed inside the uterus. Copper makes the uterus and fallopian tubes produce a fluid that kills sperm. It can stay in place for 10 years.  Hormone IUD. This type of IUD contains the hormone progestin (synthetic progesterone). The hormone thickens the cervical mucus and prevents sperm from entering the uterus, and it also thins the uterine lining to prevent implantation of a fertilized egg. The hormone can weaken or kill the sperm that get into the uterus. It can stay in place for 3-5 years, depending on which type of IUD is used. Permanent methods of contraception  Female tubal ligation. This is when the woman's fallopian tubes are surgically sealed, tied, or blocked to prevent the egg from traveling to the uterus.  Hysteroscopic sterilization. This involves placing a small coil or insert into each fallopian tube. Your doctor uses a technique called hysteroscopy to do the procedure. The device causes scar tissue to form. This results in permanent blockage of the fallopian tubes, so the sperm cannot fertilize the egg. It takes about 3 months after the procedure for the tubes to become blocked. You must use another form of birth control for these 3 months.  Female sterilization. This is when the female has the tubes that carry sperm tied off (vasectomy).This blocks sperm from entering the vagina during sexual intercourse. After the procedure, the man can still ejaculate fluid (semen). Natural planning methods  Natural family planning. This is not having sexual intercourse or using a barrier method (condom, diaphragm, cervical cap) on days the woman could become pregnant.  Calendar method. This is keeping track of the length of each menstrual cycle and identifying when you are fertile.  Ovulation method. This is avoiding sexual intercourse during ovulation.  Symptothermal method. This is avoiding sexual intercourse during ovulation, using a thermometer and ovulation symptoms.  Post-ovulation  method. This is timing sexual intercourse after you have ovulated. Regardless of which type or method of contraception you choose, it is important that you use condoms to protect against the transmission of sexually transmitted infections (STIs). Talk with your health care provider about which form of contraception is most appropriate for you. This information is not intended to replace advice given to you by your health care provider. Make sure you discuss any questions you have with your health care provider. Document Released: 12/28/2004 Document Revised: 06/05/2015 Document Reviewed: 06/22/2012 Elsevier Interactive Patient Education  2017 [employer].

## 2015-11-25 NOTE — MAU Provider Note (Signed)
Chief Complaint: Vaginal Bleeding   First Provider Initiated Contact with Patient 11/25/15 2054     SUBJECTIVE HPI: Victoria Young is a 40 y.o. [id number] female who presents to [facility] reporting irregular and sometimes heavy bleeding over the past month and half. Reports heavy bleeding over the past 7 days, initially saturating 8 tampons per day, now down to 4 tampons per day. States she had unprotected intercourse in mid-October and early November and took the morning after pill after each episode. States she had a positive home pregnancy test 11/16/2015.   Has not discussed these problems with her gynecologist. Has not taken any medication to decrease or control her bleeding. Is not on any birth control now nor has she been on any recently.  Associated signs and symptoms: Negative for fever, chills, passage of clots or tissue, GI complaints, urinary complaints or abdominal pain (has had some cramping over the past month).  Past Medical History:  Diagnosis Date  . Hypertension    OB History  Gravida Para Term Preterm AB Living  3 3 3     3   SAB TAB Ectopic Multiple Live Births          3    # Outcome Date GA Lbr Len/2nd Weight Sex Delivery Anes PTL Lv  3 Term      CS-LTranv     2 Term      CS-LTranv     1 Term      Vag-Spont        Past Surgical History:  Procedure Laterality Date  . CESAREAN SECTION     x2   Social History   Social History  . Marital status: Married    Spouse name: N/A  . Number of children: N/A  . Years of education: N/A   Occupational History  . Not on file.   Social History Main Topics  . Smoking status: Never Smoker  . Smokeless tobacco: Not on file  . Alcohol use Yes     Comment: Occas.  . Drug use: No  . Sexual activity: Not on file   Other Topics Concern  . Not on file   Social History Narrative  . No narrative on file   Family History  Problem Relation Age of Onset  . Hypertension Mother   . Hypertension Father    No  current facility-administered medications on file prior to encounter.    Current Outpatient Prescriptions on File Prior to Encounter  Medication Sig Dispense Refill  . hydrochlorothiazide (HYDRODIURIL) 12.5 MG tablet Take 1 tablet (12.5 mg total) by mouth daily. 30 tablet 0  . cyclobenzaprine (FLEXERIL) 10 MG tablet Take 1 tablet (10 mg total) by mouth 2 (two) times daily as needed for muscle spasms. (Patient not taking: Reported on 11/25/2015) 20 tablet 0  . hydrocortisone 2.5 % lotion Apply topically 2 (two) times daily. (Patient not taking: Reported on 11/25/2015) 59 mL 0  . pantoprazole (PROTONIX) 20 MG tablet Take 1 tablet (20 mg total) by mouth daily. (Patient not taking: Reported on 11/25/2015) 30 tablet 0   No Known Allergies  I have reviewed patient's Past Medical Hx, Surgical Hx, Family Hx, Social Hx, medications and allergies.   Review of Systems  Constitutional: Negative for chills and fever.  Gastrointestinal: Negative for abdominal pain, constipation, diarrhea, nausea and vomiting.  Genitourinary: Positive for vaginal bleeding. Negative for dysuria, hematuria, vaginal discharge and vaginal pain.  Neurological: Negative for dizziness.    OBJECTIVE Patient Vitals for the  past 24 hrs:  BP Temp Temp src Pulse Resp Height Weight  11/25/15 2255 140/89 - - 67 - - -  11/25/15 2018 156/91 98.1 F (36.7 C) Oral 67 20 5\' 6"  (1.676 m) 182 lb 12 oz (82.9 kg)  Body mass index is 29.5 kg/m.  Constitutional: Well-developed, well-nourished female in no acute distress.  Skin: No pallor. Cardiovascular: normal rate Respiratory: normal rate and effort.  GI: Abd soft, non-tender. Pos BS x 4 MS: Extremities nontender, no edema, normal ROM Neurologic: Alert and oriented x 4.  GU: Neg CVAT.  SPECULUM EXAM: Patient reports small amount of blood on tampon. Speculum exam declined.  LAB RESULTS Results for orders placed or performed during the hospital encounter of 11/25/15 (from the past  24 hour(s))  Urinalysis, Routine w reflex microscopic (not at [facility])     Status: Abnormal   Collection Time: 11/25/15  8:21 PM  Result Value Ref Range   Color, Urine YELLOW YELLOW   APPearance CLEAR CLEAR   Specific Gravity, Urine >1.030 (H) 1.005 - 1.030   pH 6.0 5.0 - 8.0   Glucose, UA NEGATIVE NEGATIVE mg/dL   Hgb urine dipstick NEGATIVE NEGATIVE   Bilirubin Urine NEGATIVE NEGATIVE   Ketones, ur NEGATIVE NEGATIVE mg/dL   Protein, ur NEGATIVE NEGATIVE mg/dL   Nitrite NEGATIVE NEGATIVE   Leukocytes, UA NEGATIVE NEGATIVE  Pregnancy, urine POC     Status: None   Collection Time: 11/25/15  8:32 PM  Result Value Ref Range   Preg Test, Ur NEGATIVE NEGATIVE  CBC with Differential/Platelet     Status: Abnormal   Collection Time: 11/25/15  8:58 PM  Result Value Ref Range   WBC 5.1 4.0 - 10.5 K/uL   RBC 3.86 (L) 3.87 - 5.11 MIL/uL   Hemoglobin 10.9 (L) 12.0 - 15.0 g/dL   HCT 78.2 (L) 95.6 - 21.3 %   MCV 86.0 78.0 - 100.0 fL   MCH 28.2 26.0 - 34.0 pg   MCHC 32.8 30.0 - 36.0 g/dL   RDW 08.6 57.8 - 46.9 %   Platelets 255 150 - 400 K/uL   Neutrophils Relative % 35 %   Neutro Abs 1.8 1.7 - 7.7 K/uL   Lymphocytes Relative 60 %   Lymphs Abs 3.0 0.7 - 4.0 K/uL   Monocytes Relative 4 %   Monocytes Absolute 0.2 0.1 - 1.0 K/uL   Eosinophils Relative 1 %   Eosinophils Absolute 0.1 0.0 - 0.7 K/uL   Basophils Relative 0 %   Basophils Absolute 0.0 0.0 - 0.1 K/uL  Type and screen     Status: None   Collection Time: 11/25/15  8:58 PM  Result Value Ref Range   ABO/RH(D) A POS    Antibody Screen NEG    Sample Expiration 11/28/2015   hCG, quantitative, pregnancy     Status: None   Collection Time: 11/25/15  8:58 PM  Result Value Ref Range   hCG, Beta Chain, Quant, S <1 <5 mIU/mL    IMAGING No results found.  MAU COURSE Orders Placed This Encounter  Procedures  . Urinalysis, Routine w reflex microscopic (not at [facility])  . CBC with Differential/Platelet  . hCG, quantitative, pregnancy  .  Pregnancy, urine POC  . Type and screen  . ABO/Rh   Discuss history, exam, labs with Dr. Langston Masker. The patient declined exam. Agrees that irregular bleeding is likely to be from 2 courses of morning-after pill. Agrees with plan to prescribe Provera and follow-up in the office as  needed for menstrual irregularities.  MDM - Menometrorrhagia improving spontaneously. Likely from using running after pill twice. Mild anemia, but hemodynamically stable. Uncertain if patient had early SAB versus false positive home pregnancy test. No evidence of pregnancy today.   ASSESSMENT 1. Abnormal uterine bleeding (AUB)   2. Pregnancy test negative     PLAN Discharge home in stable conditionPer consult with Dr. Langston MaskerMorris. Bleeding precautions Rx Provera. Recommend starting contraception as soon as possible. Explained low effectiveness of emergency contraception. Refused GC chlamydia cultures and wet prep. Follow-up Information    [facility] Follow up.   Why:  as needed if menstrual irregularities continue Contact information: [address] [place] [id number] [phone]        [facility] Follow up.   Why:  As needed and gynecologic emergencies Contact information: [address] [id number] [place] [id number] [phone]           Medication List    STOP taking these medications   cyclobenzaprine 10 MG tablet Commonly known as:  FLEXERIL   hydrocortisone 2.5 % lotion   pantoprazole 20 MG tablet Commonly known as:  PROTONIX     TAKE these medications   hydrochlorothiazide 12.5 MG tablet Commonly known as:  HYDRODIURIL Take 1 tablet (12.5 mg total) by mouth daily.   medroxyPROGESTERone 10 MG tablet Commonly known as:  PROVERA Take 1 tablet (10 mg total) by mouth daily. Use for ten days   meloxicam 15 MG tablet Commonly known as:  MOBIC Take 15 mg by mouth daily as  needed.        [place] Jackquelyn Sundberg, CNM 11/25/2015  10:45 PM

## 2015-11-25 NOTE — MAU Note (Signed)
PT SAYS LMP IN OCT WAS 7TH-  THEN SHE  TOOK MORNING AFTER PILL ON 10-18 THEN  STARTED BLEEDING  AGAIN 10-30- X3 DAYS  - THEN STARTED AGAIN ON 11-7-  AND   IS  BLEEDING   NOW.   HAS TAMPON  IN .     SAYS  SAW DR NEAL  IN OCT FOR  MAMMOGRAM.        IN OCT- HAD CRAMPING .  NOW  HAS CRAMPS -THROBBING -      TOOK ADVIL- ON 11-7.     NO BIRTH CONTROL.    LAST SEX-   11-4.

## 2015-11-26 LAB — ABO/RH: ABO/RH(D): A POS

## 2016-04-22 DIAGNOSIS — Z113 Encounter for screening for infections with a predominantly sexual mode of transmission: Secondary | ICD-10-CM | POA: Diagnosis not present

## 2016-04-22 DIAGNOSIS — Z6830 Body mass index (BMI) 30.0-30.9, adult: Secondary | ICD-10-CM | POA: Diagnosis not present

## 2016-04-22 DIAGNOSIS — N760 Acute vaginitis: Secondary | ICD-10-CM | POA: Diagnosis not present

## 2016-04-22 DIAGNOSIS — Z01419 Encounter for gynecological examination (general) (routine) without abnormal findings: Secondary | ICD-10-CM | POA: Diagnosis not present

## 2016-08-03 DIAGNOSIS — R8299 Other abnormal findings in urine: Secondary | ICD-10-CM | POA: Diagnosis not present

## 2016-08-03 DIAGNOSIS — N390 Urinary tract infection, site not specified: Secondary | ICD-10-CM | POA: Diagnosis not present

## 2016-08-23 DIAGNOSIS — I10 Essential (primary) hypertension: Secondary | ICD-10-CM | POA: Diagnosis not present

## 2016-10-02 DIAGNOSIS — H1013 Acute atopic conjunctivitis, bilateral: Secondary | ICD-10-CM | POA: Diagnosis not present

## 2016-10-02 DIAGNOSIS — H40033 Anatomical narrow angle, bilateral: Secondary | ICD-10-CM | POA: Diagnosis not present

## 2016-11-05 IMAGING — DX DG CHEST 2V
2 series · 2 of 2 positions shown · non-contrast
Comparison: None.

CLINICAL DATA: Chest tightness of 1 day duration.

EXAM:
CHEST  2 VIEW

[chest pa]
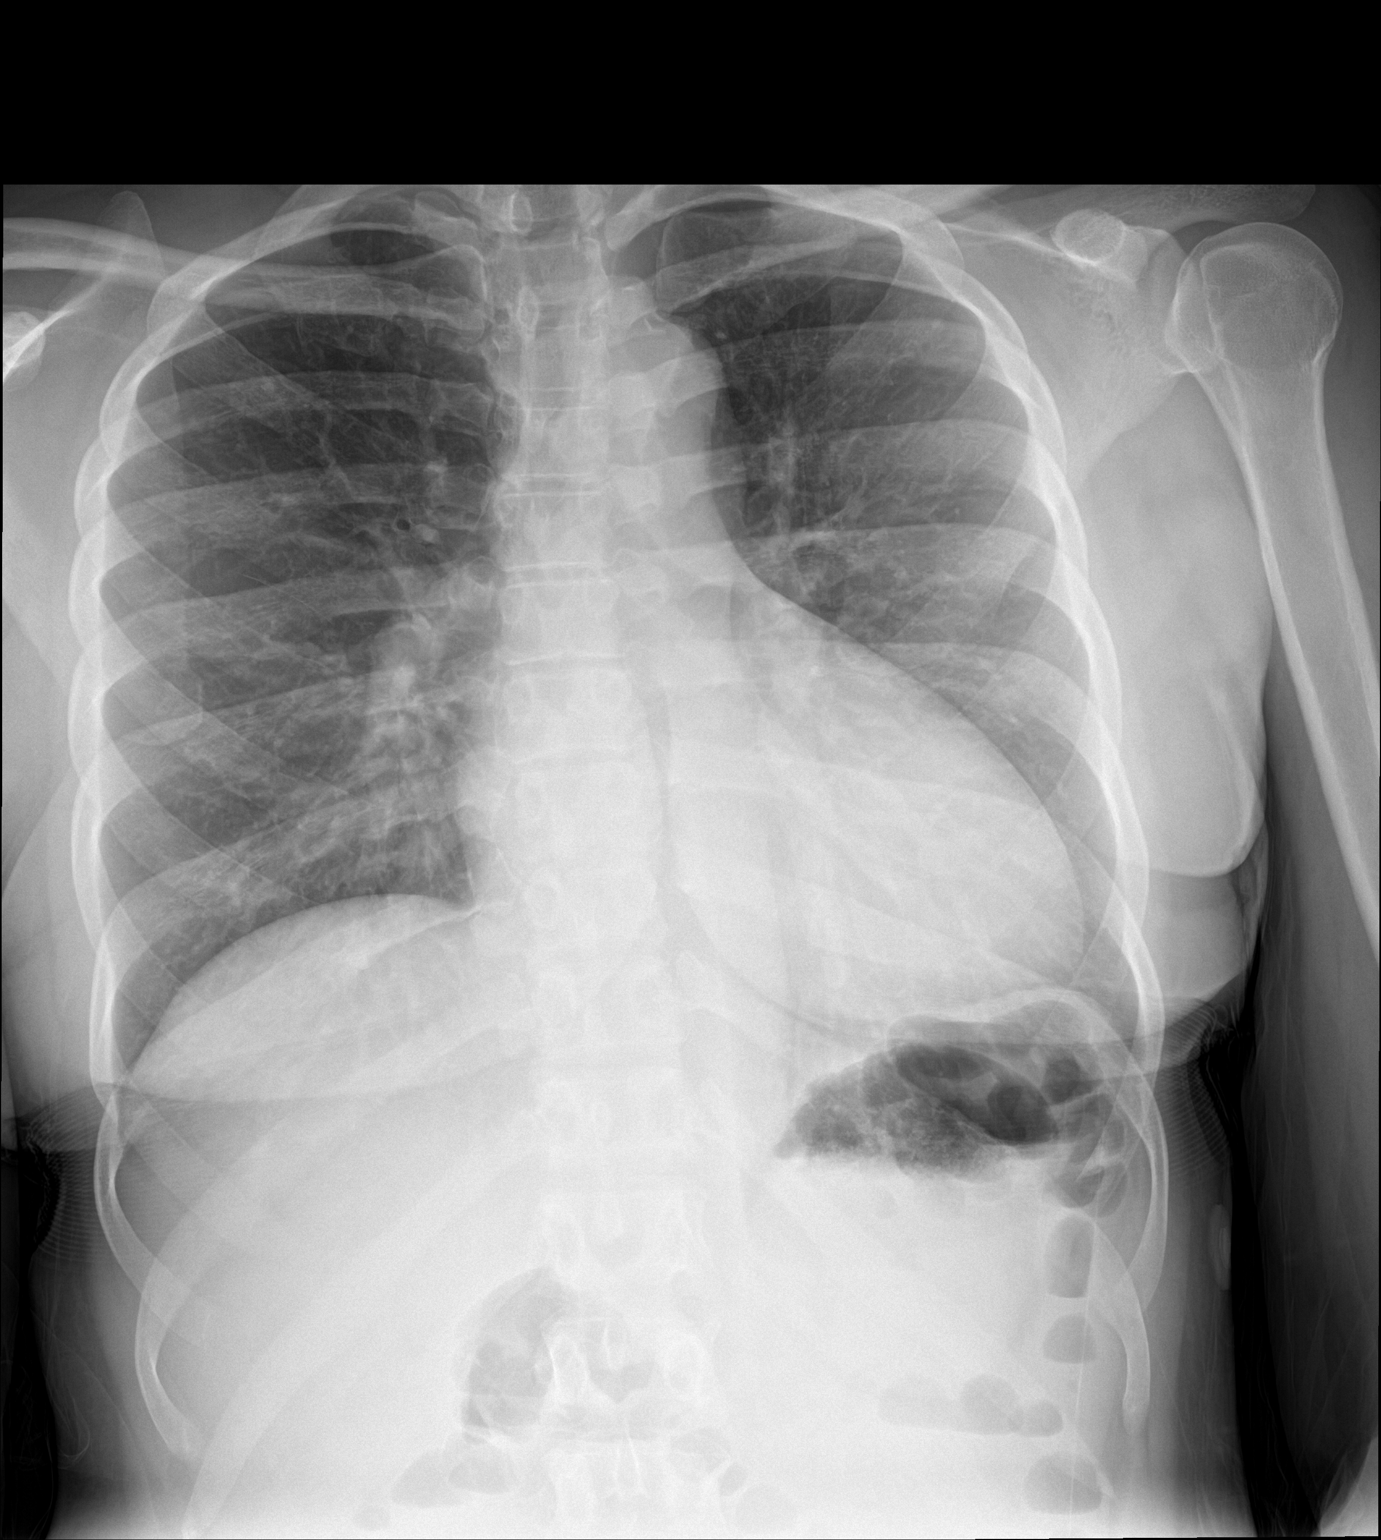

[chest lat]
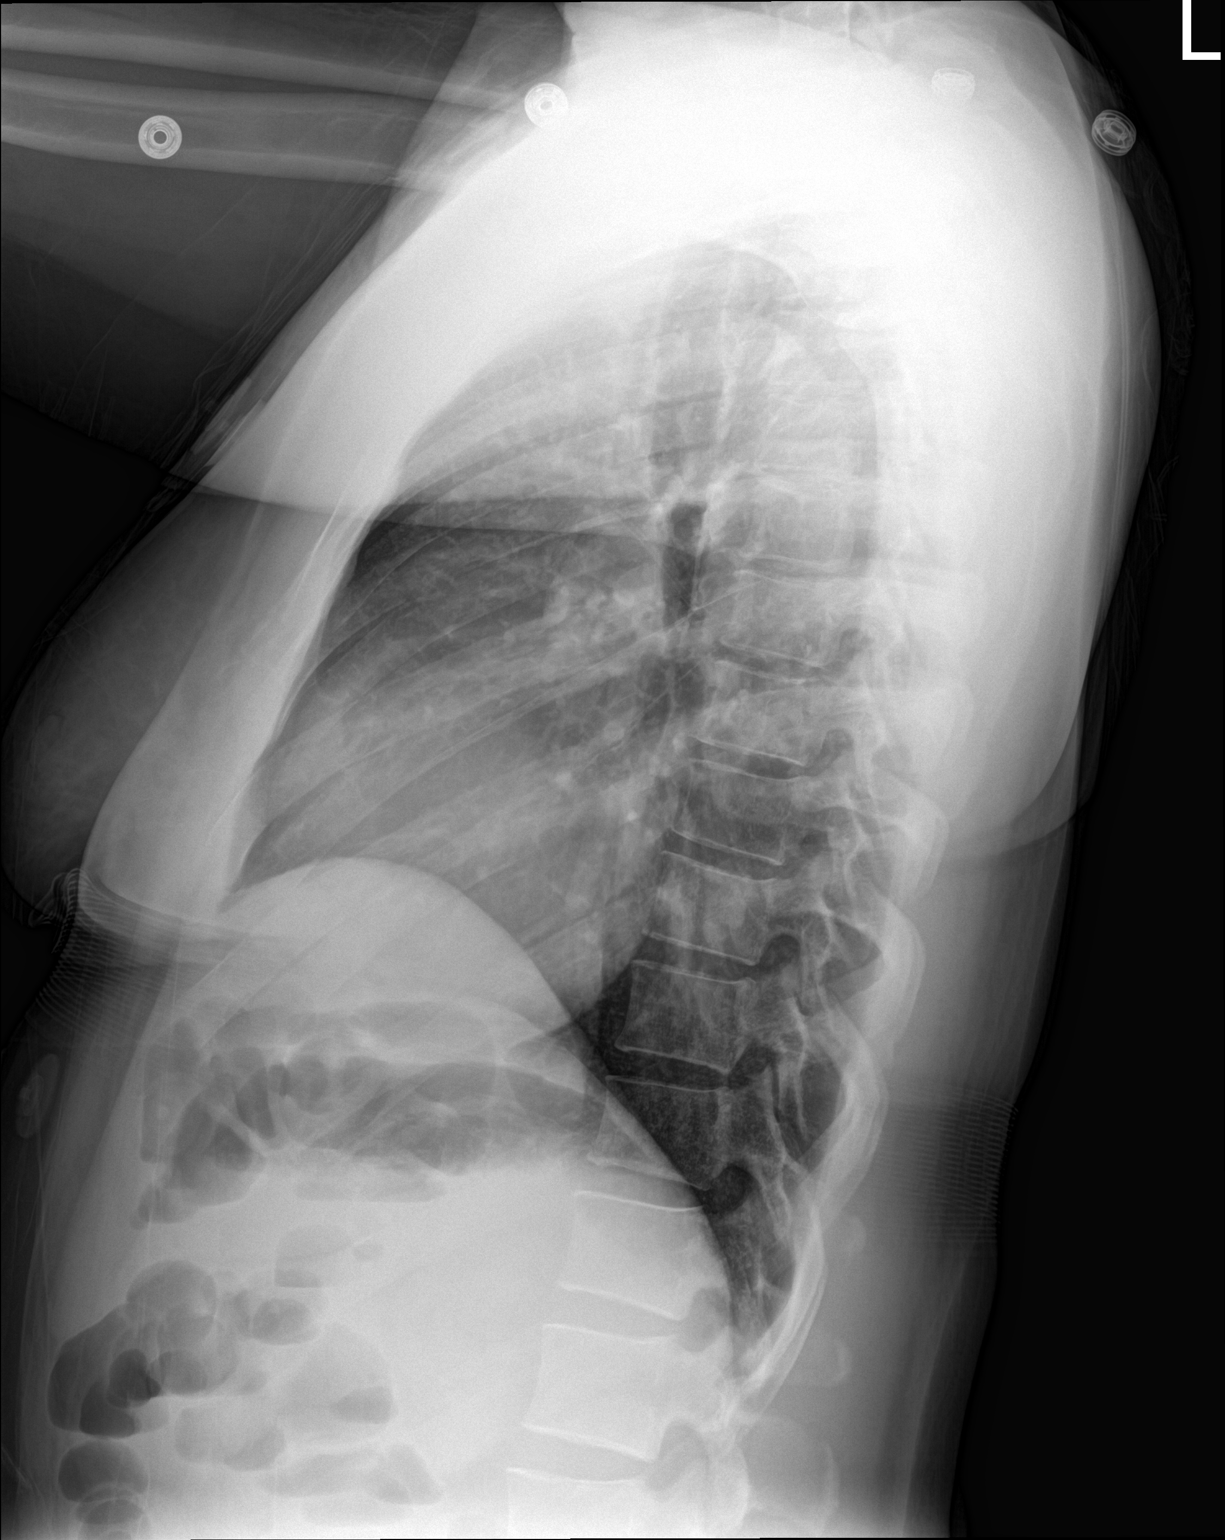

[2 of 2 positions shown; findings below may reference images not displayed]

FINDINGS: There is moderate cardiomegaly. The lungs are clear. There is no
pleural effusion. Pulmonary vasculature is normal. Hilar,
mediastinal contours are unremarkable.
IMPRESSION: Cardiomegaly.

## 2016-12-09 DIAGNOSIS — Z113 Encounter for screening for infections with a predominantly sexual mode of transmission: Secondary | ICD-10-CM | POA: Diagnosis not present

## 2017-07-21 ENCOUNTER — Encounter (HOSPITAL_COMMUNITY): Payer: Self-pay | Admitting: Emergency Medicine

## 2017-07-21 ENCOUNTER — Other Ambulatory Visit: Payer: Self-pay

## 2017-07-21 ENCOUNTER — Emergency Department (HOSPITAL_COMMUNITY)
Admission: EM | Admit: 2017-07-21 | Discharge: 2017-07-21 | Disposition: A | Discharge status: Home / Self Care | Payer: BLUE CROSS/BLUE SHIELD | Attending: Emergency Medicine | Admitting: Emergency Medicine

## 2017-07-21 DIAGNOSIS — E875 Hyperkalemia: Secondary | ICD-10-CM | POA: Diagnosis not present

## 2017-07-21 DIAGNOSIS — Z79899 Other long term (current) drug therapy: Secondary | ICD-10-CM | POA: Diagnosis not present

## 2017-07-21 DIAGNOSIS — R799 Abnormal finding of blood chemistry, unspecified: Secondary | ICD-10-CM | POA: Diagnosis not present

## 2017-07-21 DIAGNOSIS — R899 Unspecified abnormal finding in specimens from other organs, systems and tissues: Secondary | ICD-10-CM

## 2017-07-21 DIAGNOSIS — I10 Essential (primary) hypertension: Secondary | ICD-10-CM | POA: Insufficient documentation

## 2017-07-21 LAB — COMPREHENSIVE METABOLIC PANEL
ALBUMIN: 3.6 g/dL (ref 3.5–5.0)
ALT: 17 U/L (ref 0–44)
ANION GAP: 9 (ref 5–15)
AST: 18 U/L (ref 15–41)
Alkaline Phosphatase: 39 U/L (ref 38–126)
BUN: 9 mg/dL (ref 6–20)
CHLORIDE: 105 mmol/L (ref 98–111)
CO2: 26 mmol/L (ref 22–32)
Calcium: 9.9 mg/dL (ref 8.9–10.3)
Creatinine, Ser: 0.86 mg/dL (ref 0.44–1.00)
GFR calc non Af Amer: >60 mL/min (ref >60)
GLUCOSE: 94 mg/dL (ref 70–99)
Potassium: 4.1 mmol/L (ref 3.5–5.1)
SODIUM: 140 mmol/L (ref 135–145)
Total Bilirubin: 0.6 mg/dL (ref 0.3–1.2)
Total Protein: 7.4 g/dL (ref 6.5–8.1)

## 2017-07-21 LAB — URINALYSIS, ROUTINE W REFLEX MICROSCOPIC
Bilirubin Urine: NEGATIVE
GLUCOSE, UA: NEGATIVE mg/dL
HGB URINE DIPSTICK: NEGATIVE
Ketones, ur: NEGATIVE mg/dL
Leukocytes, UA: NEGATIVE
Nitrite: NEGATIVE
PH: 7 (ref 5.0–8.0)
Protein, ur: NEGATIVE mg/dL
Specific Gravity, Urine: 1.018 (ref 1.005–1.030)

## 2017-07-21 LAB — CBC
HEMATOCRIT: 35.4 % — AB (ref 36.0–46.0)
HEMOGLOBIN: 11 g/dL — AB (ref 12.0–15.0)
MCH: 26.8 pg (ref 26.0–34.0)
MCHC: 31.1 g/dL (ref 30.0–36.0)
MCV: 86.1 fL (ref 78.0–100.0)
Platelets: 240 10*3/uL (ref 150–400)
RBC: 4.11 MIL/uL (ref 3.87–5.11)
RDW: 13.5 % (ref 11.5–15.5)
WBC: 4.7 10*3/uL (ref 4.0–10.5)

## 2017-07-21 MED ORDER — SODIUM CHLORIDE 0.9 % IV BOLUS: 1000.0000 mL | Freq: Once | INTRAVENOUS | Status: DC

## 2017-07-21 NOTE — ED Triage Notes (Signed)
Pt has been feeling generally unwell the last couple of days and then took her BP last night and it was elevated. Called her PCP this morning and went in for lab work. Received a return call that her kidney function and K were concerning and to go to the ED.  No complaints other than some unusual LE swelling.

## 2017-07-21 NOTE — ED Provider Notes (Signed)
[facility] EMERGENCY DEPARTMENT Provider Note   CSN: [id number] Arrival date & time: 07/21/17  1854     History   Chief Complaint Chief Complaint  Patient presents with  . Abnormal Labs    HPI Victoria Young is a 42 y.o. female.  42 yo F with a chief complaints of hyperkalemia.  She had had some generalized complaints that she attributed to her hypertension as she had not been taking her blood pressure medication.  Saw her primary care provider who obtained routine labs and then call her back today stating that she had worsening renal function and hyperkalemia and sent her here.  She is currently asymptomatic.  She has had some lower abdominal cramping but thinks that her menstrual cycle is coming on.  She denies other complaint.  Denies chest pain or shortness of breath denies abdominal pain vomiting or diarrhea.  The history is provided by the patient.  Illness  This is a new problem. The current episode started 2 days ago. The problem occurs constantly. The problem has not changed since onset.Pertinent negatives include no chest pain, no headaches and no shortness of breath. Nothing aggravates the symptoms. Nothing relieves the symptoms. She has tried nothing for the symptoms. The treatment provided no relief.    Past Medical History:  Diagnosis Date  . Hypertension     Patient Active Problem List   Diagnosis Date Noted  . Heart murmur 05/20/2011  . Premature ventricular contractions 05/20/2011    Past Surgical History:  Procedure Laterality Date  . CESAREAN SECTION     x2     OB History    Gravida  3   Para  3   Term  3   Preterm      AB      Living  3     SAB      TAB      Ectopic      Multiple      Live Births  3            Home Medications    Prior to Admission medications   Medication Sig Start Date End Date Taking? Authorizing Provider  hydrochlorothiazide (HYDRODIURIL) 12.5 MG tablet Take 1 tablet (12.5 mg total)  by mouth daily. 12/09/14  Yes Antony Madura, PA-C  medroxyPROGESTERone (PROVERA) 10 MG tablet Take 1 tablet (10 mg total) by mouth daily. Use for ten days Patient not taking: Reported on 07/21/2017 11/25/15   Dorathy Kinsman, CNM    Family History Family History  Problem Relation Age of Onset  . Hypertension Mother   . Hypertension Father     Social History Social History   Tobacco Use  . Smoking status: Never Smoker  . Smokeless tobacco: Never Used  Substance Use Topics  . Alcohol use: Yes    Comment: Occas.  . Drug use: No     Allergies   Patient has no known allergies.   Review of Systems Review of Systems  Constitutional: Negative for chills and fever.  HENT: Negative for congestion and rhinorrhea.   Eyes: Negative for redness and visual disturbance.  Respiratory: Negative for shortness of breath and wheezing.   Cardiovascular: Negative for chest pain and palpitations.  Gastrointestinal: Negative for nausea and vomiting.  Genitourinary: Negative for dysuria and urgency.  Musculoskeletal: Negative for arthralgias and myalgias.  Skin: Negative for pallor and wound.  Neurological: Negative for dizziness and headaches.     Physical Exam Updated Vital Signs BP Marland Kitchen)  132/92   Pulse 62   Temp 99 F (37.2 C) (Oral)   Resp 17   SpO2 99%   Physical Exam  Constitutional: She is oriented to person, place, and time. She appears well-developed and well-nourished. No distress.  HENT:  Head: Normocephalic and atraumatic.  Eyes: Pupils are equal, round, and reactive to light. EOM are normal.  Neck: Normal range of motion. Neck supple.  Cardiovascular: Normal rate and regular rhythm. Exam reveals no gallop and no friction rub.  No murmur heard. Pulmonary/Chest: Effort normal. She has no wheezes. She has no rales.  Abdominal: Soft. She exhibits no distension and no mass. There is no tenderness. There is no guarding.  Musculoskeletal: She exhibits no edema or tenderness.    Neurological: She is alert and oriented to person, place, and time.  Skin: Skin is warm and dry. She is not diaphoretic.  Psychiatric: She has a normal mood and affect. Her behavior is normal.  Nursing note and vitals reviewed.    ED Treatments / Results  Labs (all labs ordered are listed, but only abnormal results are displayed) Labs Reviewed  CBC - Abnormal; Notable for the following components:      Result Value   Hemoglobin 11.0 (*)    HCT 35.4 (*)    All other components within normal limits  COMPREHENSIVE METABOLIC PANEL  URINALYSIS, ROUTINE W REFLEX MICROSCOPIC    EKG EKG Interpretation  Date/Time:  Thursday July 21 2017 19:24:35 EDT Ventricular Rate:  65 PR Interval:  166 QRS Duration: 90 QT Interval:  394 QTC Calculation: 409 R Axis:   62 Text Interpretation:  Normal sinus rhythm Possible Left atrial enlargement Borderline ECG No significant change since last tracing Confirmed by Melene Plan [phone]) on 07/21/2017 8:04:36 PM   Radiology No results found.  Procedures Procedures (including critical care time)  Medications Ordered in ED Medications  sodium chloride 0.9 % bolus 1,000 mL ( Intravenous Canceled Entry 07/21/17 2124)     Initial Impression / Assessment and Plan / ED Course  I have reviewed the triage vital signs and the nursing notes.  Pertinent labs & imaging results that were available during my care of the patient were reviewed by me and considered in my medical decision making (see chart for details).     42 yo F with a chief complaint of hyperkalemia.  The patient had been feeling unwell and she attributes this to her blood pressure being elevated because she did stop taking her medication.  She saw her family physician who drew basic labs and found her to have worsening renal function and hyperkalemia and she was referred to the ED.  Her EKG shows no concerning findings for hyperkalemia and so I will hold off treatment while waiting labs.  Her  lab work is unremarkable her renal function is mildly above her baseline her potassium is 4.1.  She has mild anemia which is better than her last lab draw couple years ago.  Discharge the patient home.  10:15 PM:  I have discussed the diagnosis/risks/treatment options with the patient and believe the pt to be eligible for discharge home to follow-up with PCP. We also discussed returning to the ED immediately if new or worsening sx occur. We discussed the sx which are most concerning (e.g., sudden worsening pain, fever, inability to tolerate by mouth) that necessitate immediate return. Medications administered to the patient during their visit and any new prescriptions provided to the patient are listed below.  Medications given during  this visit Medications  sodium chloride 0.9 % bolus 1,000 mL ( Intravenous Canceled Entry 07/21/17 2124)      The patient appears reasonably screen and/or stabilized for discharge and I doubt any other medical condition or other [facility] requiring further screening, evaluation, or treatment in the ED at this time prior to discharge.    Final Clinical Impressions(s) / ED Diagnoses   Final diagnoses:  Abnormal laboratory test    ED Discharge Orders    None       Melene PlanFloyd, Gunnard Dorrance, DO 07/21/17 2217

## 2017-08-31 DIAGNOSIS — F331 Major depressive disorder, recurrent, moderate: Secondary | ICD-10-CM | POA: Diagnosis not present

## 2017-09-06 DIAGNOSIS — Z01419 Encounter for gynecological examination (general) (routine) without abnormal findings: Secondary | ICD-10-CM | POA: Diagnosis not present

## 2017-09-06 DIAGNOSIS — Z6827 Body mass index (BMI) 27.0-27.9, adult: Secondary | ICD-10-CM | POA: Diagnosis not present

## 2017-09-06 DIAGNOSIS — Z1231 Encounter for screening mammogram for malignant neoplasm of breast: Secondary | ICD-10-CM | POA: Diagnosis not present

## 2017-09-13 DIAGNOSIS — F331 Major depressive disorder, recurrent, moderate: Secondary | ICD-10-CM | POA: Diagnosis not present

## 2017-09-29 DIAGNOSIS — F331 Major depressive disorder, recurrent, moderate: Secondary | ICD-10-CM | POA: Diagnosis not present

## 2017-10-20 ENCOUNTER — Ambulatory Visit: Payer: Self-pay | Admitting: Mental Health

## 2017-10-25 ENCOUNTER — Ambulatory Visit (INDEPENDENT_AMBULATORY_CARE_PROVIDER_SITE_OTHER): Payer: BLUE CROSS/BLUE SHIELD | Admitting: Mental Health

## 2017-10-25 DIAGNOSIS — F331 Major depressive disorder, recurrent, moderate: Secondary | ICD-10-CM | POA: Diagnosis not present

## 2017-10-25 NOTE — Progress Notes (Signed)
      [facility] Counselor/Therapist Progress Note   Patient ID: DARI CARPENITO, MRN: [id number]  Date: 10/25/2017  Timespent: 45 minutes  Treatment Type: Individual  Subjective: Bereaved over loss of son. Has days that feels she is in a zone. Dreads his upcoming birthday in November, Thanksgiving, Christmas and [place] Years. Trying to work, but boss is concerned that some days she is not able. Sad. Interrupted sleep.   Interventions:CBT  Mental Status Exam:   Appearance:   Well Groomed     Behavior:  Appropriate and Sharing  Motor:  Normal  Speech/Language:   Clear and Coherent  Affect:  Congruent, Depressed and Flat  Mood:  anxious and depressed  Thought process:  Coherent  Thought content:    WDL  Perceptual disturbances:    Normal  Orientation:  Full (Time, Place, and Person)  Attention:  Good  Concentration:  good  Memory:  Immediate  Fund of knowledge:   Good  Insight:    Good  Judgment:   Intact  Impulse Control:  good   Symptoms:                     Sadness, interrupted sleep, adhedonia, low motivation, low energy, low appetite and weight loss.  Risk Assessment: Danger to Self:  No Self-injurious Behavior: No Danger to Others: No Duty to Warn:no Physical Aggression / Violence:No  Access to Firearms a concern: No  Gang Involvement:No   Diagnosis:   ICD-10-CM   1. Major depressive disorder, recurrent episode, moderate (HCC) F33.1      Plan: Write lettter to boss at work to give her more leeway for work hours. Try to decided how best to spend upcoming holidays. Continue self-care program. Try not to isolate. Return to office in 2 weeks.  Ulice Bold, [place]

## 2017-11-10 ENCOUNTER — Ambulatory Visit (INDEPENDENT_AMBULATORY_CARE_PROVIDER_SITE_OTHER): Payer: BLUE CROSS/BLUE SHIELD | Admitting: Mental Health

## 2017-11-10 DIAGNOSIS — F331 Major depressive disorder, recurrent, moderate: Secondary | ICD-10-CM | POA: Diagnosis not present

## 2017-11-10 NOTE — Progress Notes (Signed)
      [facility] Counselor/Therapist Progress Note   Patient ID: Victoria Young, MRN: [id number]  Date: 11/10/2017  Timespent: 45 minutes   Treatment Type: Individual   Reported Symptoms: Panic attacks, Obsessive thinking, Sleep disturbance, Isolation and withdrawal, Fatigue and Lack of motivation   Mental Status Exam:    Appearance:   Casual     Behavior:  Appropriate  Motor:  Normal  Speech/Language:   Clear and Coherent  Affect:  Depressed and Tearful  Mood:  anxious, depressed and sad  Thought process:  sorrowful  Thought content:    WNL  Sensory/Perceptual disturbances:    WNL  Orientation:  oriented to person, place, time/date and situation  Attention:  Good  Concentration:  Fair  Memory:  WNL  Fund of knowledge:   Good  Insight:    Good  Judgment:   Good  Impulse Control:  Good     Risk Assessment: Danger to Self:  No Self-injurious Behavior: No Danger to Others: No Duty to Warn:no Physical Aggression / Violence:No  Access to Firearms a concern: No  Gang Involvement:No    Subjective:  Bereavement issues following loss of son. Son's birthday is Novemeber 10. Concern about how can get through the holidays. Sleep still erratic. Working at job as she is able.   Interventions: Cognitive Behavioral Therapy, Grief Therapy, Interpersonal and supportive   Diagnosis:   ICD-10-CM   1. Major depressive disorder, recurrent episode, moderate (HCC) F33.1      Plan:  Self-care program            See PCP to increase sleep medication            Pro-actively plan to avoid iolation now and during holidays            Develop support network            Return to office in two weeks   Ulice Bold, [facility]

## 2017-11-23 ENCOUNTER — Ambulatory Visit (INDEPENDENT_AMBULATORY_CARE_PROVIDER_SITE_OTHER): Payer: BLUE CROSS/BLUE SHIELD | Admitting: Mental Health

## 2017-11-23 DIAGNOSIS — F331 Major depressive disorder, recurrent, moderate: Secondary | ICD-10-CM | POA: Diagnosis not present

## 2017-11-23 NOTE — Progress Notes (Signed)
      [facility] Counselor/Therapist Progress Note   Patient ID: Victoria HartshornQumara B Young, MRN: [id number]  Date: 11/23/2017  Timespent: 45 minutes   Treatment Type: Individual   Reported Symptoms: Obsessive thinking, Sleep disturbance, Isolation and withdrawal, Lack of motivation and depression and anxiety   Mental Status Exam:    Appearance:   Casual     Behavior:  Appropriate  Motor:  Normal  Speech/Language:   Normal Rate  Affect:  Depressed, Labile and anxious  Mood:  anxious and depressed  Thought process:  normal  Thought content:    WNL  Sensory/Perceptual disturbances:    Flashback  Orientation:  oriented to person, place and time/date  Attention:  Good  Concentration:  Fair  Memory:  WNL  Fund of knowledge:   Good  Insight:    Good  Judgment:   Good  Impulse Control:  Good     Risk Assessment: Danger to Self:  No Self-injurious Behavior: No Danger to Others: No Duty to Warn:no Physical Aggression / Violence:No  Access to Firearms a concern: No  Gang Involvement:No    Subjective:  Having a very difficult time with emotional dysregulation since son was murdered. Trying to work. Very stressed. Sleeping sufficiently with ambien. Low motivation. Loss of interest in activities.   Interventions: Cognitive Behavioral Therapy, Grief Therapy, Insight-Oriented, Interpersonal and supportive   Diagnosis:   ICD-10-CM   1. Major depressive disorder, recurrent episode, moderate (HCC) F33.1      Plan: Grief issues support and resolution            Develop social and support network            Learn new computer program at work            Validation and support    [employer] Claudeen Leason, [facility]

## 2017-12-06 DIAGNOSIS — Z3202 Encounter for pregnancy test, result negative: Secondary | ICD-10-CM | POA: Diagnosis not present

## 2017-12-06 DIAGNOSIS — Z30430 Encounter for insertion of intrauterine contraceptive device: Secondary | ICD-10-CM | POA: Diagnosis not present

## 2017-12-14 ENCOUNTER — Ambulatory Visit (INDEPENDENT_AMBULATORY_CARE_PROVIDER_SITE_OTHER): Payer: BLUE CROSS/BLUE SHIELD | Admitting: Mental Health

## 2017-12-14 ENCOUNTER — Encounter: Payer: Self-pay | Admitting: Mental Health

## 2017-12-14 DIAGNOSIS — F331 Major depressive disorder, recurrent, moderate: Secondary | ICD-10-CM

## 2017-12-14 NOTE — Progress Notes (Signed)
      [facility] Counselor/Therapist Progress Note  Patient ID: Victoria Young, MRN: [id number],    Date: 12/14/2017  Time Spent: 45 minute  Treatment Type: Individual Therapy  Reported Symptoms: grieving, interrupted sleep, appetite up and down  Mental Status Exam:  Appearance:   Casual     Behavior:  Appropriate, Sharing and tearful  Motor:  Normal  Speech/Language:   Clear and Coherent  Affect:  Depressed  Mood:  depressed  Thought process:  normal  Thought content:    WNL  Sensory/Perceptual disturbances:    WNL  Orientation:  oriented to person, place and time/date  Attention:  Good  Concentration:  poor focus some ddays  Memory:  WNL  Fund of knowledge:   Good  Insight:    Good  Judgment:   Good  Impulse Control:  Good   Risk Assessment: Danger to Self:  No Self-injurious Behavior: No Danger to Others: No Duty to Warn:no Physical Aggression / Violence:No  Access to Firearms a concern: No  Gang Involvement:No   Subjective:  Still very much grieved over the loss of son. Sleep is interrupted. Still crying a lot. Trying to find new things to get her mind on. Forming a support group. Also attending [employer] group.  Interventions: Cognitive Behavioral Therapy, Grief Therapy, Family Systems and Interpersonal  Diagnosis:   ICD-10-CM   1. Major depressive disorder, recurrent episode, moderate (HCC) F33.1     Plan:    Grief issues              Develop support network              Manage job responsibilities              Validation and support   Ulice Boldarson Troy Hartzog, [facility]

## 2018-01-03 DIAGNOSIS — H40033 Anatomical narrow angle, bilateral: Secondary | ICD-10-CM | POA: Diagnosis not present

## 2018-01-03 DIAGNOSIS — H04123 Dry eye syndrome of bilateral lacrimal glands: Secondary | ICD-10-CM | POA: Diagnosis not present

## 2018-01-10 ENCOUNTER — Ambulatory Visit: Payer: BLUE CROSS/BLUE SHIELD | Admitting: Mental Health

## 2018-01-24 DIAGNOSIS — Z23 Encounter for immunization: Secondary | ICD-10-CM | POA: Diagnosis not present

## 2018-01-24 DIAGNOSIS — Z0000 Encounter for general adult medical examination without abnormal findings: Secondary | ICD-10-CM | POA: Diagnosis not present

## 2018-01-24 DIAGNOSIS — I10 Essential (primary) hypertension: Secondary | ICD-10-CM | POA: Diagnosis not present

## 2018-05-23 DIAGNOSIS — M5432 Sciatica, left side: Secondary | ICD-10-CM | POA: Diagnosis not present

## 2018-05-23 DIAGNOSIS — S76312A Strain of muscle, fascia and tendon of the posterior muscle group at thigh level, left thigh, initial encounter: Secondary | ICD-10-CM | POA: Diagnosis not present

## 2018-05-23 DIAGNOSIS — M79605 Pain in left leg: Secondary | ICD-10-CM | POA: Diagnosis not present

## 2018-06-13 DIAGNOSIS — M5416 Radiculopathy, lumbar region: Secondary | ICD-10-CM | POA: Diagnosis not present

## 2018-06-28 DIAGNOSIS — I10 Essential (primary) hypertension: Secondary | ICD-10-CM | POA: Insufficient documentation

## 2018-07-05 DIAGNOSIS — M5432 Sciatica, left side: Secondary | ICD-10-CM | POA: Diagnosis not present

## 2018-07-10 DIAGNOSIS — M5432 Sciatica, left side: Secondary | ICD-10-CM | POA: Diagnosis not present

## 2018-07-25 DIAGNOSIS — I10 Essential (primary) hypertension: Secondary | ICD-10-CM | POA: Diagnosis not present

## 2018-08-01 DIAGNOSIS — I10 Essential (primary) hypertension: Secondary | ICD-10-CM | POA: Diagnosis not present

## 2018-09-09 DIAGNOSIS — Z111 Encounter for screening for respiratory tuberculosis: Secondary | ICD-10-CM | POA: Diagnosis not present

## 2018-09-11 DIAGNOSIS — Z111 Encounter for screening for respiratory tuberculosis: Secondary | ICD-10-CM | POA: Diagnosis not present

## 2019-01-26 DIAGNOSIS — Z0000 Encounter for general adult medical examination without abnormal findings: Secondary | ICD-10-CM | POA: Diagnosis not present

## 2019-01-26 DIAGNOSIS — Z833 Family history of diabetes mellitus: Secondary | ICD-10-CM | POA: Diagnosis not present

## 2019-01-26 DIAGNOSIS — I10 Essential (primary) hypertension: Secondary | ICD-10-CM | POA: Diagnosis not present

## 2019-01-26 DIAGNOSIS — Z862 Personal history of diseases of the blood and blood-forming organs and certain disorders involving the immune mechanism: Secondary | ICD-10-CM | POA: Diagnosis not present

## 2019-01-30 DIAGNOSIS — Z833 Family history of diabetes mellitus: Secondary | ICD-10-CM | POA: Diagnosis not present

## 2019-01-30 DIAGNOSIS — Z0000 Encounter for general adult medical examination without abnormal findings: Secondary | ICD-10-CM | POA: Diagnosis not present

## 2019-01-30 DIAGNOSIS — Z862 Personal history of diseases of the blood and blood-forming organs and certain disorders involving the immune mechanism: Secondary | ICD-10-CM | POA: Diagnosis not present

## 2019-01-30 DIAGNOSIS — I10 Essential (primary) hypertension: Secondary | ICD-10-CM | POA: Diagnosis not present

## 2019-02-06 DIAGNOSIS — M79601 Pain in right arm: Secondary | ICD-10-CM | POA: Diagnosis not present

## 2019-02-06 DIAGNOSIS — I10 Essential (primary) hypertension: Secondary | ICD-10-CM | POA: Diagnosis not present

## 2019-02-06 DIAGNOSIS — R12 Heartburn: Secondary | ICD-10-CM | POA: Diagnosis not present

## 2019-02-06 DIAGNOSIS — R079 Chest pain, unspecified: Secondary | ICD-10-CM | POA: Diagnosis not present

## 2019-02-28 DIAGNOSIS — Z01419 Encounter for gynecological examination (general) (routine) without abnormal findings: Secondary | ICD-10-CM | POA: Diagnosis not present

## 2019-03-01 DIAGNOSIS — Z1231 Encounter for screening mammogram for malignant neoplasm of breast: Secondary | ICD-10-CM | POA: Diagnosis not present

## 2019-03-27 NOTE — Progress Notes (Signed)
 Subjective:   [facility] at [facility]  Patient ID: Victoria Young is a 44 y.o. female who is experiencing symptoms suggestive of COVID.  HR notified the clinic provider that the employee is feeling sick and waiting in the car.  Called the employee for a telehealth phone visit lasting 20 minutes.  HPI: Onset of sxs: 03/26/19  Worse/better since onset: worse   Sxs include c/o  Feeling tired, muscle aches,headache and chills. She has on and off HA x 1 week . HA pain on right temporal area , throbbing . Pain rate 8/10 . Light and noise does not bother her. She denies SOB not feeling dizzy. She has chest congestion. She denies numbness to one side of the body.  She thinks the HA is due to her  BP elevated  and requesting to check her BP . She has chills this am thought is from the cold weather .  She feel tired and has body aches. The symptoms is on and off x more than a week. She is  working more than 10 hours a day 6 days a week.   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  Today's visit was completed via a real-time telehealth phone encounter. A telehealth visit was utilized in order to decrease the patient's potential exposure to COVID-19 vs an in-person visit. The patient/authorized person provided oral consent at the time of the visit to engaging in a telemedicine encounter with the present provider at [facility]. The patient/authorized person was informed of the potential benefits,  limitations, and risks of telemedicine. The patient/authorized person expressed understanding that the laws that protect confidentiality also apply to telemedicine. The patient/authorized person acknowledged understanding that telemedicine does not provide emergency services and that he or she would need to call 911 or proceed to the nearest hospital for help if such a need arose.  Objective:   Vital Signs T 97.3 HR 82 O2 sat 97%  BP : 124/90 right arm  Vitals & appearance assessed during curbside swab collection.  Physical Exam Constitutional:      Appearance: Normal appearance.  Neurological:     General: No focal deficit present.     Mental Status: She is alert and oriented to person, place, and time.  Psychiatric:        Mood and Affect: Mood normal.        Thought Content: Thought content normal.     Limited due to curb side testing  Assessment/Plan:    COVID-19 virus RNA not detected  Fatigue, unspecified type -     POCT COVID BD  Chills (without fever) -     POCT COVID BD  Body aches -     POCT COVID BD       Verbal consent for treatment/testing was received from patient for curbside COVID testing. Full PPE worn including gloves, gown, face protection, N95 mask. Proper swab used for COVID specimen collection.       PLAN:   Rapid test is negative  She decline PCR test . She correlate her symptoms from overworked but does not want to go home due to a lot of things to do at work.  She will notify me if her symptoms get worse and will not come to work if feel bad when she is home .   NEGATIVE RAPID COVID TEST: Out of work until PCR results have returned.  Must have negative test result & be improving without fever for 24 hrs before able to RTW. Must remain at home while awaiting test results, should not expose self to others, wear a mask when in presence of other household contacts, social distance & practice hand hygiene throughout the day. Stay well hydrated,  eat balanced meals, may use OTC fever reducers if no contraindications. Call clinic if sxs worsen (cough, SOB, fatigue, vomiting/diarrhea, persistent fever) or seek immediate medical attention for difficulty breathing, significant weakness, change in mental status, persistent vomiting/diarrhea.          Electronically signed by: Rivka Webb Portal, FNP 03/27/19 1158

## 2019-05-29 DIAGNOSIS — F4321 Adjustment disorder with depressed mood: Secondary | ICD-10-CM | POA: Diagnosis not present

## 2019-05-29 DIAGNOSIS — I10 Essential (primary) hypertension: Secondary | ICD-10-CM | POA: Diagnosis not present

## 2019-06-19 DIAGNOSIS — I10 Essential (primary) hypertension: Secondary | ICD-10-CM | POA: Diagnosis not present

## 2019-10-14 DIAGNOSIS — Z20822 Contact with and (suspected) exposure to covid-19: Secondary | ICD-10-CM | POA: Diagnosis not present

## 2021-06-01 ENCOUNTER — Ambulatory Visit: Payer: BLUE CROSS/BLUE SHIELD | Admitting: Orthopedic Surgery

## 2021-10-12 DIAGNOSIS — Z13220 Encounter for screening for lipoid disorders: Secondary | ICD-10-CM | POA: Diagnosis not present

## 2021-10-12 DIAGNOSIS — Z0000 Encounter for general adult medical examination without abnormal findings: Secondary | ICD-10-CM | POA: Diagnosis not present

## 2021-10-12 DIAGNOSIS — I10 Essential (primary) hypertension: Secondary | ICD-10-CM | POA: Diagnosis not present

## 2021-11-16 DIAGNOSIS — I10 Essential (primary) hypertension: Secondary | ICD-10-CM | POA: Diagnosis not present

## 2023-08-01 ENCOUNTER — Other Ambulatory Visit (HOSPITAL_COMMUNITY): Payer: Self-pay

## 2023-08-01 ENCOUNTER — Encounter (HOSPITAL_COMMUNITY): Payer: Self-pay

## 2023-08-01 ENCOUNTER — Emergency Department (HOSPITAL_COMMUNITY): Payer: Self-pay

## 2023-08-01 ENCOUNTER — Emergency Department (HOSPITAL_COMMUNITY)
Admission: EM | Admit: 2023-08-01 | Discharge: 2023-08-01 | Disposition: A | Discharge status: Home / Self Care | Payer: Self-pay | Attending: Emergency Medicine | Admitting: Emergency Medicine

## 2023-08-01 ENCOUNTER — Emergency Department (EMERGENCY_DEPARTMENT_HOSPITAL): Payer: Self-pay

## 2023-08-01 ENCOUNTER — Telehealth (HOSPITAL_COMMUNITY): Payer: Self-pay | Admitting: Pharmacy Technician

## 2023-08-01 ENCOUNTER — Other Ambulatory Visit: Payer: Self-pay

## 2023-08-01 DIAGNOSIS — M7989 Other specified soft tissue disorders: Secondary | ICD-10-CM

## 2023-08-01 DIAGNOSIS — I82451 Acute embolism and thrombosis of right peroneal vein: Secondary | ICD-10-CM | POA: Diagnosis not present

## 2023-08-01 DIAGNOSIS — I10 Essential (primary) hypertension: Secondary | ICD-10-CM | POA: Diagnosis not present

## 2023-08-01 DIAGNOSIS — M79604 Pain in right leg: Secondary | ICD-10-CM | POA: Diagnosis not present

## 2023-08-01 DIAGNOSIS — Z86718 Personal history of other venous thrombosis and embolism: Secondary | ICD-10-CM | POA: Insufficient documentation

## 2023-08-01 LAB — BASIC METABOLIC PANEL WITH GFR
Anion gap: 10 (ref 5–15)
BUN: 10 mg/dL (ref 6–20)
CO2: 23 mmol/L (ref 22–32)
Calcium: 9 mg/dL (ref 8.9–10.3)
Chloride: 104 mmol/L (ref 98–111)
Creatinine, Ser: 0.73 mg/dL (ref 0.44–1.00)
GFR, Estimated: >60 mL/min (ref >60)
Glucose, Bld: 91 mg/dL (ref 70–99)
Potassium: 3.9 mmol/L (ref 3.5–5.1)
Sodium: 137 mmol/L (ref 135–145)

## 2023-08-01 LAB — CBC WITH DIFFERENTIAL/PLATELET
Abs Immature Granulocytes: 0.02 K/uL (ref 0.00–0.07)
Basophils Absolute: 0 K/uL (ref 0.0–0.1)
Basophils Relative: 0 %
Eosinophils Absolute: 0.1 K/uL (ref 0.0–0.5)
Eosinophils Relative: 1 %
HCT: 40.3 % (ref 36.0–46.0)
Hemoglobin: 12.9 g/dL (ref 12.0–15.0)
Immature Granulocytes: 0 %
Lymphocytes Relative: 29 %
Lymphs Abs: 1.4 K/uL (ref 0.7–4.0)
MCH: 28.7 pg (ref 26.0–34.0)
MCHC: 32 g/dL (ref 30.0–36.0)
MCV: 89.8 fL (ref 80.0–100.0)
Monocytes Absolute: 0.6 K/uL (ref 0.1–1.0)
Monocytes Relative: 13 %
Neutro Abs: 2.7 K/uL (ref 1.7–7.7)
Neutrophils Relative %: 57 %
Platelets: 248 K/uL (ref 150–400)
RBC: 4.49 MIL/uL (ref 3.87–5.11)
RDW: 12.8 % (ref 11.5–15.5)
WBC: 4.8 K/uL (ref 4.0–10.5)
nRBC: 0 % (ref 0.0–0.2)

## 2023-08-01 MED ORDER — APIXABAN (ELIQUIS) EDUCATION KIT FOR DVT/PE PATIENTS
PACK | Freq: Once | Status: AC
  Filled 2023-08-01: qty 1

## 2023-08-01 MED ORDER — HYDROCHLOROTHIAZIDE 12.5 MG PO TABS: 12.5000 mg | ORAL_TABLET | Freq: Every day | ORAL | 1 refills | Status: DC

## 2023-08-01 MED ORDER — APIXABAN 5 MG PO TABS
10.0000 mg | ORAL_TABLET | Freq: Two times a day (BID) | ORAL | Status: DC
  Administered 2023-08-01: 10 mg via ORAL
  Filled 2023-08-01: qty 2

## 2023-08-01 MED ORDER — APIXABAN (ELIQUIS) VTE STARTER PACK (10MG AND 5MG): ORAL_TABLET | ORAL | 0 refills | Status: DC

## 2023-08-01 MED ORDER — APIXABAN 5 MG PO TABS: 5.0000 mg | ORAL_TABLET | Freq: Two times a day (BID) | ORAL | Status: DC

## 2023-08-01 MED ORDER — HYDROCHLOROTHIAZIDE 12.5 MG PO TABS
12.5000 mg | ORAL_TABLET | Freq: Every day | ORAL | 1 refills | Status: DC
  Filled 2023-08-01: qty 30, 30d supply, fill #0

## 2023-08-01 MED ORDER — APIXABAN (ELIQUIS) VTE STARTER PACK (10MG AND 5MG)
ORAL_TABLET | ORAL | 0 refills | Status: DC
  Filled 2023-08-01: qty 74, 30d supply, fill #0

## 2023-08-01 NOTE — ED Notes (Signed)
 Pt transported to [place]

## 2023-08-01 NOTE — ED Notes (Signed)
 PIV attempted x2 by 2 RN.

## 2023-08-01 NOTE — ED Triage Notes (Signed)
 Pt c/o right hip pain that started 3 days ago after going to [place] the day before. Pt denies injury. Pt states she was unable to walk on it d/t pain. Pt has intermittent numbness in right calf, denies at this time.

## 2023-08-01 NOTE — ED Notes (Signed)
 Notified Triage RN of abnormal BP reading of 179/117.

## 2023-08-01 NOTE — ED Notes (Signed)
 Patient transported to X-ray

## 2023-08-01 NOTE — ED Notes (Signed)
 Pt returned from [place]

## 2023-08-01 NOTE — Discharge Instructions (Addendum)
 You have a blood clot right calf.  You were started on a blood thinner called Eliquis .  Be careful when you take this medicine.  If you ever suffer head injury please present to your nearest emergency department for evaluation with a CT scan to rule out any concerning brain bleed.  Do not take any ibuprofen or Aleve while you are taking this medicine unless you talk to your doctor first.  X-ray did not show any concerning findings.  Follow-up with your primary care doctor.  Return for any emergent symptoms. I have also given you a referral to hematology for further evaluation of why you might have ended up with the blood clot.

## 2023-08-01 NOTE — ED Notes (Signed)
 Awaiting eliquis  education kit from main pharmacy

## 2023-08-01 NOTE — ED Notes (Signed)
Pharmacy at bedside

## 2023-08-01 NOTE — ED Provider Notes (Cosign Needed Addendum)
 [place] EMERGENCY DEPARTMENT AT [facility] Provider Note   CSN: [id number] Arrival date & time: 08/01/23  9195     Patient presents with: Leg Pain   Victoria Young is a 48 y.o. female.   48 year old female presents today for concern of right leg pain.  This started day after she went to [employer] [place]  [place] [employer] [place] and pain started on Saturday.  She states that she took apple cider vinegar which did help the leg pain however her hip pain persisted.  She states she has to components to her leg pain.  1 is localized to her hip but the other is like a heaviness sensation in her leg.  This improved after the apple cider vinegar.  She did repeat a dose of this yesterday.  She also stated she noticed her blood pressure to be elevated.  Previously took hydrochlorothiazide  but states she has not taken this in a year because she has been focusing on lifestyle changes.  Saturday was difficult for her to bear weight however this has significantly improved.  No previous history of DVT or PE.  No recent long road trip, periods of inactivity, recent flight, or recent injury to the leg.  The history is provided by the patient. No language interpreter was used.       Prior to Admission medications   Medication Sig Start Date End Date Taking? Authorizing Provider  hydrochlorothiazide  (HYDRODIURIL ) 12.5 MG tablet Take 1 tablet (12.5 mg total) by mouth daily. 12/09/14   Keith Sor, PA-C  medroxyPROGESTERone  (PROVERA ) 10 MG tablet Take 1 tablet (10 mg total) by mouth daily. Use for ten days Patient not taking: Reported on 07/21/2017 11/25/15   Claudene, [place] , CNM    Allergies: Patient has no known allergies.    Review of Systems  Constitutional:  Negative for fever.  Musculoskeletal:  Positive for arthralgias.  Neurological:  Negative for light-headedness.  All other systems reviewed and are negative.   Updated Vital Signs BP (!) 179/117 (BP Location: Right Arm)    Pulse 81   Temp 98.2 F (36.8 C)   Resp 17   Ht 5' 5 (1.651 m)   Wt 78 kg   LMP 07/22/2023 (Approximate)   SpO2 100%   BMI 28.62 kg/m   Physical Exam Vitals and nursing note reviewed.  Constitutional:      General: She is not in acute distress.    Appearance: Normal appearance. She is not ill-appearing.  HENT:     Head: Normocephalic and atraumatic.     Nose: Nose normal.  Eyes:     Conjunctiva/sclera: Conjunctivae normal.  Pulmonary:     Effort: Pulmonary effort is normal. No respiratory distress.  Musculoskeletal:        General: No deformity. Normal range of motion.     Comments: Right hip without tenderness.  Left hip without tenderness.   Lumbar spine without tenderness to palpation.  Full range of motion of bilateral lower extremities.  Sensation intact.  Right ankle, right knee without tenderness to palpation.  5/5 strength in extensor and flexor muscle groups.  Swelling extremity compared to the left.  Skin:    Findings: No rash.  Neurological:     Mental Status: She is alert.     (all labs ordered are listed, but only abnormal results are displayed) Labs Reviewed  CBC WITH DIFFERENTIAL/PLATELET  BASIC METABOLIC PANEL WITH GFR    EKG: None  Radiology: DG HIP UNILAT WITH PELVIS  2-3 VIEWS RIGHT Result Date: 08/01/2023 CLINICAL DATA:  Hip pain EXAM: DG HIP (WITH OR WITHOUT PELVIS) 3V RIGHT COMPARISON:  None Available. FINDINGS: There is no evidence of hip fracture or dislocation. Mild bilateral lateral acetabular over coverage. Radiopaque intrauterine device projects over the midline pelvis. IMPRESSION: 1. No acute fracture or dislocation. 2. Mild bilateral lateral acetabular over coverage, which can be seen in the setting of pincer-type femoral acetabular impingement. Electronically Signed   By: Limin  Xu M.D.   On: 08/01/2023 08:52     Procedures   Medications Ordered in the ED - No data to display                                  Medical Decision  Making Amount and/or Complexity of Data Reviewed Labs: ordered.  Risk Prescription drug management.   Medical Decision Making / ED Course   This patient presents to the ED for concern of leg pain, this involves an extensive number of treatment options, and is a complaint that carries with it a high risk of complications and morbidity.  The differential diagnosis includes arthritis, fracture, contusion, DVT  MDM: 48 year old female presents today for concern of right leg pain. Swelling noted on exam.  DVT study ordered. X-ray obtained and is negative for any bony abnormality.  DVT study positive. CBC and BMP unremarkable. Discussion had with patient at length regarding positive DVT study and need for Eliquis .  Risks and benefits discussed of Eliquis .  Patient is in agreement to proceed.  Will put in for pharmacy education as well as the initial dose. Patient voices understanding and is in agreement.  Discharged in stable condition.  Return precaution discussed.  Patient was able to speak with pharmacy.  Prescription sent to [facility] pharmacy at pharmacy's request.  Given this was an unprovoked DVT we will give referral to hematology. She denies any recent travel in the past few months.  Denies any extended periods of being sedentary.  Has a copper IUD in place.  No prior history of clots.  No history of cancer.  Patient also has uncontrolled hypertension.  Noncompliant with her BP meds for the past 1 year.  She is requesting a refill.  No chest pain, shortness of breath, balance issues, or vision change.  No signs of hypertensive emergency.  Medicine refilled.  However stressed the importance of keeping a blood pressure diary and following up with her primary care doctor.  Lab Tests: -I ordered, reviewed, and interpreted labs.   The pertinent results include:   Labs Reviewed  CBC WITH DIFFERENTIAL/PLATELET  BASIC METABOLIC PANEL WITH GFR      EKG  EKG Interpretation Date/Time:     Ventricular Rate:    PR Interval:    QRS Duration:    QT Interval:    QTC Calculation:   R Axis:      Text Interpretation:           Imaging Studies ordered: I ordered imaging studies including DVT study, hip x-ray I independently visualized and interpreted imaging. I agree with the radiologist interpretation   Medicines ordered and prescription drug management: No orders of the defined types were placed in this encounter.   -I have reviewed the patients home medicines and have made adjustments as needed  Reevaluation: After the interventions noted above, I reevaluated the patient and found that they have :improved  Co morbidities that complicate the patient evaluation  Past Medical History:  Diagnosis Date   Hypertension       Dispostion: Discharged in stable condition.  Return precaution discussed.  Patient voices understanding and is in agreement with plan.   Final diagnoses:  Acute deep vein thrombosis (DVT) of right peroneal vein (HCC)  Uncontrolled hypertension    ED Discharge Orders          Ordered    APIXABAN  (ELIQUIS ) VTE STARTER PACK (10MG  AND 5MG )  Status:  Discontinued       Note to Pharmacy: If starter pack unavailable, substitute with seventy-four 5 mg apixaban  tabs following the above SIG directions.   08/01/23 1116    APIXABAN  (ELIQUIS ) VTE STARTER PACK (10MG  AND 5MG )       Note to Pharmacy: If starter pack unavailable, substitute with seventy-four 5 mg apixaban  tabs following the above SIG directions.   08/01/23 1154    Ambulatory referral to Hematology / Oncology       Comments: Your emergency department provider has referred you to see a hematology/oncology specialist. These are physicians who specialize in blood disorders and cancers, or findings concerning for cancer. You will receive a phone call from the [facility] Office to set up your appointment within 2 business days: [employer] operate Mon - Fri, 8:00 a.m. to 5:00 p.m.;  closed for federally recognized holidays. Please be sure your phone is not set to block numbers during this time.   08/01/23 1156    hydrochlorothiazide  (HYDRODIURIL ) 12.5 MG tablet  Daily,   Status:  Discontinued        08/01/23 1213    hydrochlorothiazide  (HYDRODIURIL ) 12.5 MG tablet  Daily        08/01/23 1213               Hildegard Loge, PA-C 08/01/23 1211    Hildegard Loge, PA-C 08/01/23 1214    Cottie Donnice PARAS, MD 08/02/23 [phone]

## 2023-08-01 NOTE — ED Notes (Signed)
Requested phleb draw labs

## 2023-08-01 NOTE — Telephone Encounter (Signed)
 Pharmacy Patient Advocate Encounter  Insurance verification completed.    The patient is insured through [employer]. Patient has [employer], may use a copay card, and/or apply for patient assistance if available.    Ran test claim for Eliquis  5mg  tablets and the current 30 day co-pay is $585.78.   This test claim was processed through [facility]- copay amounts may vary at other pharmacies due to pharmacy/plan contracts, or as the patient moves through the different stages of their insurance plan.

## 2023-08-01 NOTE — ED Notes (Signed)
 Patient discharged by RN, patient verbalized understanding of instructions. Ambulatory to lobby at time of discharge.

## 2023-08-04 DIAGNOSIS — Z7901 Long term (current) use of anticoagulants: Secondary | ICD-10-CM | POA: Diagnosis not present

## 2023-08-04 DIAGNOSIS — M25511 Pain in right shoulder: Secondary | ICD-10-CM | POA: Diagnosis not present

## 2023-08-04 DIAGNOSIS — R519 Headache, unspecified: Secondary | ICD-10-CM | POA: Diagnosis not present

## 2023-08-04 DIAGNOSIS — Z86718 Personal history of other venous thrombosis and embolism: Secondary | ICD-10-CM | POA: Diagnosis not present

## 2023-09-08 ENCOUNTER — Inpatient Hospital Stay: Payer: Self-pay

## 2023-09-08 ENCOUNTER — Telehealth: Payer: Self-pay

## 2023-09-08 ENCOUNTER — Inpatient Hospital Stay: Payer: Self-pay | Admitting: Oncology

## 2023-09-08 NOTE — Telephone Encounter (Signed)
 Left a voicemail with pt regarding her missed new hematology and lab appointments  with Dr. Autumn. I did LVM with her that someone would be calling to reschedule.

## 2023-09-14 ENCOUNTER — Other Ambulatory Visit: Payer: Self-pay

## 2023-09-14 ENCOUNTER — Inpatient Hospital Stay: Attending: Oncology | Admitting: Oncology

## 2023-09-14 ENCOUNTER — Inpatient Hospital Stay

## 2023-09-14 ENCOUNTER — Encounter: Payer: Self-pay | Admitting: Oncology

## 2023-09-14 VITALS — BP 161/97 | HR 65 | Temp 97.8°F | Resp 17 | Ht 65.0 in | Wt 184.0 lb

## 2023-09-14 DIAGNOSIS — I82451 Acute embolism and thrombosis of right peroneal vein: Secondary | ICD-10-CM

## 2023-09-14 DIAGNOSIS — Z7901 Long term (current) use of anticoagulants: Secondary | ICD-10-CM | POA: Insufficient documentation

## 2023-09-14 DIAGNOSIS — Z0000 Encounter for general adult medical examination without abnormal findings: Secondary | ICD-10-CM | POA: Insufficient documentation

## 2023-09-14 LAB — CBC WITH DIFFERENTIAL (CANCER CENTER ONLY)
Abs Immature Granulocytes: 0.01 K/uL (ref 0.00–0.07)
Basophils Absolute: 0 K/uL (ref 0.0–0.1)
Basophils Relative: 0 %
Eosinophils Absolute: 0 K/uL (ref 0.0–0.5)
Eosinophils Relative: 1 %
HCT: 36.6 % (ref 36.0–46.0)
Hemoglobin: 11.9 g/dL — ABNORMAL LOW (ref 12.0–15.0)
Immature Granulocytes: 0 %
Lymphocytes Relative: 35 %
Lymphs Abs: 1.5 K/uL (ref 0.7–4.0)
MCH: 28.6 pg (ref 26.0–34.0)
MCHC: 32.5 g/dL (ref 30.0–36.0)
MCV: 88 fL (ref 80.0–100.0)
Monocytes Absolute: 0.4 K/uL (ref 0.1–1.0)
Monocytes Relative: 10 %
Neutro Abs: 2.2 K/uL (ref 1.7–7.7)
Neutrophils Relative %: 54 %
Platelet Count: 280 K/uL (ref 150–400)
RBC: 4.16 MIL/uL (ref 3.87–5.11)
RDW: 13.2 % (ref 11.5–15.5)
WBC Count: 4.1 K/uL (ref 4.0–10.5)
nRBC: 0 % (ref 0.0–0.2)

## 2023-09-14 LAB — CMP (CANCER CENTER ONLY)
ALT: 20 U/L (ref 0–44)
AST: 23 U/L (ref 15–41)
Albumin: 3.8 g/dL (ref 3.5–5.0)
Alkaline Phosphatase: 35 U/L — ABNORMAL LOW (ref 38–126)
Anion gap: 4 — ABNORMAL LOW (ref 5–15)
BUN: 14 mg/dL (ref 6–20)
CO2: 28 mmol/L (ref 22–32)
Calcium: 9.1 mg/dL (ref 8.9–10.3)
Chloride: 107 mmol/L (ref 98–111)
Creatinine: 0.63 mg/dL (ref 0.44–1.00)
GFR, Estimated: >60 mL/min (ref >60)
Glucose, Bld: 86 mg/dL (ref 70–99)
Potassium: 4.1 mmol/L (ref 3.5–5.1)
Sodium: 139 mmol/L (ref 135–145)
Total Bilirubin: 0.5 mg/dL (ref 0.0–1.2)
Total Protein: 7.5 g/dL (ref 6.5–8.1)

## 2023-09-14 LAB — D-DIMER, QUANTITATIVE: D-Dimer, Quant: <0.27 ug{FEU}/mL (ref 0.00–0.50)

## 2023-09-14 MED ORDER — APIXABAN 5 MG PO TABS: 5.0000 mg | ORAL_TABLET | Freq: Two times a day (BID) | ORAL | 2 refills | Status: DC

## 2023-09-14 NOTE — Assessment & Plan Note (Signed)
 She is due for screening mammogram.  She also did not have a colonoscopy previously.  She was advised to discuss this with her PCP for age-appropriate screening

## 2023-09-14 NOTE — Progress Notes (Signed)
 [facility]  HEMATOLOGY CLINIC CONSULTATION NOTE   PATIENT NAME: Victoria Young   MR#: [id number] DOB: 11/08/1975  DATE OF SERVICE: 09/14/2023   REFERRING PROVIDER  Amjad Hildegard, PA-C  Patient Care Team: Alvera Reagin, PA as PCP - General (Physician Assistant)   REASON FOR CONSULTATION/ CHIEF COMPLAINT:  Right lower extremity DVT diagnosed in July 2025.  ASSESSMENT & PLAN:  Victoria Young is a 48 y.o. lady with a past medical history of hypertension, plantar fasciitis, sciatica, was referred to our service for evaluation after she was diagnosed with right lower extremity DVT on 08/01/2023.    Acute deep vein thrombosis (DVT) of right peroneal vein (HCC) First episode of DVT in the right peroneal vein, identified in July.   No provoking factors such as immobility, surgery, or hormonal therapy.   Family history of pulmonary embolism in sister.  Currently on Eliquis  5 mg twice daily.   The clot is relatively small, located in the calf veins. No symptoms of chest pain or dyspnea. Slight swelling in the right leg persists but is not significant.   The condition is considered unprovoked, necessitating investigation for thrombophilia.  Today we will check factor V Leiden mutation, prothrombin gene mutation, beta-2  glycoprotein antibodies, anticardiolipin antibodies.  Rest of the thrombophilia workup will be deferred as it can be falsely abnormal given recent thrombosis and also given concurrent Eliquis  use.  - Continue Eliquis  5 mg twice daily until at least the end of October.  - Send prescription for Eliquis  to [facility] on [address].  - Schedule follow-up ultrasound in late October to assess clot resolution.  - Review blood work results via phone call in a couple of weeks.  - Schedule follow-up appointment in November.  Health care maintenance She is due for screening mammogram.  She also did not have a colonoscopy previously.  She was advised to discuss this  with her PCP for age-appropriate screening   I reviewed lab results and outside records for this visit and discussed relevant results with the patient. Diagnosis, plan of care and treatment options were also discussed in detail with the patient. Opportunity provided to ask questions and answers provided to her apparent satisfaction. Provided instructions to call our clinic with any problems, questions or concerns prior to return visit. I recommended to continue follow-up with PCP and sub-specialists. She verbalized understanding and agreed with the plan. No barriers to learning was detected.  Chinita Patten, MD  09/14/2023 3:10 PM  [facility] - A DEPT OF [facility] [address] [place] [address] Dept: [phone] Dept Fax: [phone]   HISTORY OF PRESENT ILLNESS:  Discussed the use of AI scribe software for clinical note transcription with the patient, who gave verbal consent to proceed.  History of Present Illness Victoria Young is a 48 year old female who presents with a right leg deep vein thrombosis (DVT) follow-up.  Patient presented to the ED on 08/01/2022 with complaints of right lower extremity pain for 3 days prior to arrival.  Ultrasound of the lower extremities showed findings consistent with acute DVT involving the right peroneal veins.  She was started on Eliquis .  DVT was seemingly unprovoked based on history.  Hence a referral was sent to us  for further evaluation and thrombophilia workup.  This is her first episode of DVT, and she has no personal history of blood clots. Her sister had a pulmonary  embolism at a similar age.  She has been on Eliquis , initially at a higher dose of two tablets twice a day, now reduced to one tablet twice a day. She has only two pills left. She denies any recent surgeries, prolonged immobility, or use of hormonal birth control that could have contributed to the clot. She has an  IUD and previously used medroxyprogesterone  in 2017.  She reports improvement in her right leg pain but experienced swelling in her right ankle and pain in her left hip over the weekend, which resolved spontaneously. No chest pain or difficulty breathing, except when exercising. She has a history of sciatica, with pain primarily in her hip rather than her calf, which affects her ability to walk.  Her social history includes working as a Engineer, structural in group homes, which involves physical activity. She does not smoke and stays hydrated. She is up to date with her mammograms, having had one at age 26, and has no family history of colon cancer.  Negative Thrombotic Risk Factors: Previous VTE, Recent surgery (within 3 months), Recent trauma (within 3 months), Recent admission to hospital with acute illness (within 3 months), Paralysis, paresis, or recent plaster cast immobilization of lower extremity, Central venous catheterization, Bed rest >72 hours within 3 months, Sedentary journey lasting >8 hours within 4 weeks, Pregnancy, Within 6 weeks postpartum, Recent cesarean section (within 3 months), Estrogen therapy, Testosterone therapy, Erythropoiesis-stimulating agent, Recent COVID diagnosis (within 3 months), Active cancer, Non-malignant, chronic inflammatory condition, Known thrombophilic condition, Smoking, Older age    MEDICAL HISTORY Past Medical History:  Diagnosis Date   Hypertension    Plantar fasciitis 11/10/2015   Sprain of medial collateral ligament of left knee 04/01/2015     SURGICAL HISTORY Past Surgical History:  Procedure Laterality Date   CESAREAN SECTION     x2     SOCIAL HISTORY: She reports that she has never smoked. She has never used smokeless tobacco. She reports current alcohol use. She reports that she does not use drugs. Social History   Socioeconomic History   Marital status: Married    Spouse name: Not on file   Number of children: Not on file   Years of  education: Not on file   Highest education level: Not on file  Occupational History   Not on file  Tobacco Use   Smoking status: Never   Smokeless tobacco: Never  Vaping Use   Vaping status: Never Used  Substance and Sexual Activity   Alcohol use: Yes    Comment: Occas.   Drug use: No   Sexual activity: Not on file  Other Topics Concern   Not on file  Social History Narrative   Not on file   Social Drivers of Health   Financial Resource Strain: Not on file  Food Insecurity: No Food Insecurity (09/14/2023)   Hunger Vital Sign    Worried About Running Out of Food in the Last Year: Never true    Ran Out of Food in the Last Year: Never true  Transportation Needs: No Transportation Needs (09/14/2023)   PRAPARE - Administrator, Civil Service (Medical): No    Lack of Transportation (Non-Medical): No  Physical Activity: Not on file  Stress: Not on file  Social Connections: Not on file  Intimate Partner Violence: Not At Risk (09/14/2023)   Humiliation, Afraid, Rape, and Kick questionnaire    Fear of Current or Ex-Partner: No    Emotionally Abused: No    Physically Abused:  No    Sexually Abused: No    FAMILY HISTORY: Her family history includes Hypertension in her father and mother.  CURRENT MEDICATIONS   Current Outpatient Medications  Medication Instructions   apixaban  (ELIQUIS ) 5 mg, Oral, 2 times daily   hydrochlorothiazide  (HYDRODIURIL ) 12.5 mg, Oral, Daily     ALLERGIES  She has no known allergies.  REVIEW OF SYSTEMS:  Review of Systems - Oncology   Rest of the pertinent review of systems is unremarkable except as mentioned above in HPI.  PHYSICAL EXAMINATION:   Onc Performance Status - 09/14/23 0929       ECOG Perf Status   ECOG Perf Status Fully active, able to carry on all pre-disease performance without restriction      KPS SCALE   KPS % SCORE Normal, no compliants, no evidence of disease          Vitals:   09/14/23 0920  BP: (!)  161/97  Pulse: 65  Resp: 17  Temp: 97.8 F (36.6 C)  SpO2: 100%   Filed Weights   09/14/23 0920  Weight: 184 lb (83.5 kg)    Physical Exam Constitutional:      General: She is not in acute distress.    Appearance: Normal appearance.  HENT:     Head: Normocephalic and atraumatic.  Cardiovascular:     Rate and Rhythm: Normal rate.     Heart sounds: Normal heart sounds.  Pulmonary:     Effort: Pulmonary effort is normal. No respiratory distress.     Breath sounds: Normal breath sounds.  Abdominal:     General: There is no distension.  Musculoskeletal:     Right lower leg: No edema.     Left lower leg: No edema.  Lymphadenopathy:     Cervical: No cervical adenopathy.  Neurological:     General: No focal deficit present.     Mental Status: She is alert and oriented to person, place, and time.  Psychiatric:        Mood and Affect: Mood normal.        Behavior: Behavior normal.     LABORATORY DATA:   I have reviewed the data as listed.  Results for orders placed or performed in visit on 09/14/23  CBC with Differential (Cancer Center Only)  Result Value Ref Range   WBC Count 4.1 4.0 - 10.5 K/uL   RBC 4.16 3.87 - 5.11 MIL/uL   Hemoglobin 11.9 (L) 12.0 - 15.0 g/dL   HCT 63.3 63.9 - 53.9 %   MCV 88.0 80.0 - 100.0 fL   MCH 28.6 26.0 - 34.0 pg   MCHC 32.5 30.0 - 36.0 g/dL   RDW 86.7 88.4 - 84.4 %   Platelet Count 280 150 - 400 K/uL   nRBC 0.0 0.0 - 0.2 %   Neutrophils Relative % 54 %   Neutro Abs 2.2 1.7 - 7.7 K/uL   Lymphocytes Relative 35 %   Lymphs Abs 1.5 0.7 - 4.0 K/uL   Monocytes Relative 10 %   Monocytes Absolute 0.4 0.1 - 1.0 K/uL   Eosinophils Relative 1 %   Eosinophils Absolute 0.0 0.0 - 0.5 K/uL   Basophils Relative 0 %   Basophils Absolute 0.0 0.0 - 0.1 K/uL   Immature Granulocytes 0 %   Abs Immature Granulocytes 0.01 0.00 - 0.07 K/uL  CMP (Cancer Center only)  Result Value Ref Range   Sodium 139 135 - 145 mmol/L   Potassium 4.1 3.5 - 5.1 mmol/L  Chloride 107 98 - 111 mmol/L   CO2 28 22 - 32 mmol/L   Glucose, Bld 86 70 - 99 mg/dL   BUN 14 6 - 20 mg/dL   Creatinine 9.36 9.55 - 1.00 mg/dL   Calcium 9.1 8.9 - 89.6 mg/dL   Total Protein 7.5 6.5 - 8.1 g/dL   Albumin 3.8 3.5 - 5.0 g/dL   AST 23 15 - 41 U/L   ALT 20 0 - 44 U/L   Alkaline Phosphatase 35 (L) 38 - 126 U/L   Total Bilirubin 0.5 0.0 - 1.2 mg/dL   GFR, Estimated >39 >39 mL/min   Anion gap 4 (L) 5 - 15  D-dimer, quantitative  Result Value Ref Range   D-Dimer, Quant <0.27 0.00 - 0.50 ug/mL-FEU     RADIOGRAPHIC STUDIES:  I have reviewed the ultrasound report from 08/01/23.   Orders Placed This Encounter  Procedures   CBC with Differential (Cancer Center Only)    Standing Status:   Future    Number of Occurrences:   1    Expiration Date:   09/13/2024   CMP (Cancer Center only)    Standing Status:   Future    Number of Occurrences:   1    Expiration Date:   09/13/2024   D-dimer, quantitative    Standing Status:   Future    Number of Occurrences:   1    Expiration Date:   09/13/2024   Factor 5 leiden    Standing Status:   Future    Number of Occurrences:   1    Expiration Date:   09/13/2024   Beta-2 -glycoprotein i abs, IgG/M/A    Standing Status:   Future    Number of Occurrences:   1    Expiration Date:   09/13/2024   Cardiolipin antibodies, IgG, IgM, IgA    Standing Status:   Future    Number of Occurrences:   1    Expiration Date:   09/13/2024   Prothrombin gene mutation    Standing Status:   Future    Number of Occurrences:   1    Expiration Date:   09/13/2024    Future Appointments  Date Time Provider Department Center  09/28/2023  9:15 AM Kelee Cunningham, Chinita, MD [facility] None  11/17/2023  9:15 AM [facility] LAB [facility] None  11/17/2023  9:45 AM Aralyn Nowak, MD [facility] None    I spent a total of 55 minutes during this encounter with the patient including review of chart and various tests results, discussions about plan of care and coordination of  care plan.  This document was completed utilizing speech recognition software. Grammatical errors, random word insertions, pronoun errors, and incomplete sentences are an occasional consequence of this system due to software limitations, ambient noise, and hardware issues. Any formal questions or concerns about the content, text or information contained within the body of this dictation should be directly addressed to the provider for clarification.

## 2023-09-14 NOTE — Assessment & Plan Note (Signed)
 First episode of DVT in the right peroneal vein, identified in July.   No provoking factors such as immobility, surgery, or hormonal therapy.   Family history of pulmonary embolism in sister.  Currently on Eliquis  5 mg twice daily.   The clot is relatively small, located in the calf veins. No symptoms of chest pain or dyspnea. Slight swelling in the right leg persists but is not significant.   The condition is considered unprovoked, necessitating investigation for thrombophilia.  Today we will check factor V Leiden mutation, prothrombin gene mutation, beta-2  glycoprotein antibodies, anticardiolipin antibodies.  Rest of the thrombophilia workup will be deferred as it can be falsely abnormal given recent thrombosis and also given concurrent Eliquis  use.  - Continue Eliquis  5 mg twice daily until at least the end of October.  - Send prescription for Eliquis  to [facility] on [address].  - Schedule follow-up ultrasound in late October to assess clot resolution.  - Review blood work results via phone call in a couple of weeks.  - Schedule follow-up appointment in November.

## 2023-09-16 LAB — BETA-2-GLYCOPROTEIN I ABS, IGG/M/A
Beta-2 Glyco I IgG: <9 GPI IgG units (ref 0–20)
Beta-2-Glycoprotein I IgA: <9 GPI IgA units (ref 0–25)
Beta-2-Glycoprotein I IgM: <9 GPI IgM units (ref 0–32)

## 2023-09-17 LAB — CARDIOLIPIN ANTIBODIES, IGG, IGM, IGA
Anticardiolipin IgA: <9 U/mL (ref 0–11)
Anticardiolipin IgG: <9 GPL U/mL (ref 0–14)
Anticardiolipin IgM: <9 [MPL'U]/mL (ref 0–12)

## 2023-09-19 LAB — FACTOR 5 LEIDEN

## 2023-09-19 LAB — PROTHROMBIN GENE MUTATION

## 2023-09-28 ENCOUNTER — Inpatient Hospital Stay (HOSPITAL_BASED_OUTPATIENT_CLINIC_OR_DEPARTMENT_OTHER): Admitting: Oncology

## 2023-09-28 DIAGNOSIS — R21 Rash and other nonspecific skin eruption: Secondary | ICD-10-CM | POA: Diagnosis not present

## 2023-09-28 DIAGNOSIS — Z832 Family history of diseases of the blood and blood-forming organs and certain disorders involving the immune mechanism: Secondary | ICD-10-CM

## 2023-09-28 DIAGNOSIS — Z7901 Long term (current) use of anticoagulants: Secondary | ICD-10-CM

## 2023-09-28 DIAGNOSIS — I82451 Acute embolism and thrombosis of right peroneal vein: Secondary | ICD-10-CM

## 2023-09-28 NOTE — Assessment & Plan Note (Signed)
 First episode of DVT in the right peroneal vein, identified in July 2025.  No provoking factors such as immobility, surgery, or hormonal therapy.   Family history of pulmonary embolism in sister.  Currently on Eliquis  5 mg twice daily.    The clot is relatively small, located in the calf veins. No symptoms of chest pain or dyspnea. Slight swelling in the right leg persists but is not significant.    The condition is considered unprovoked, necessitating investigation for thrombophilia.  On her consultation with us  on 09/14/2023, we checked factor V Leiden mutation, prothrombin gene mutation, beta-2  glycoprotein antibodies, anticardiolipin antibodies and all of these were unremarkable.  D-dimer was undetectable.  Rest of the thrombophilia workup was deferred as it can be falsely abnormal given recent thrombosis and also given concurrent Eliquis  use.   - Continue Eliquis  5 mg twice daily until at least the end of October.    - Schedule follow-up ultrasound in late October to assess clot resolution.   - Schedule follow-up appointment in November.  We will obtain remaining thrombophilia workup on return visit.

## 2023-09-28 NOTE — Progress Notes (Signed)
 [facility]  HEMATOLOGY-ONCOLOGY ELECTRONIC VISIT PROGRESS NOTE  PATIENT NAME: Victoria Young   MR#: [id number] DOB: 1975/03/23  DATE OF SERVICE: 09/28/2023  Patient Care Team: Redmon, Noelle, PA as PCP - General (Physician Assistant)  I connected with the patient via telephone conference and verified that I am speaking with the correct person using two identifiers. The patient's location is at home and I am providing care from the [facility].  I discussed the limitations, risks, security and privacy concerns of performing an evaluation and management service by e-visits and the availability of in person appointments. I also discussed with the patient that there may be a patient responsible charge related to this service. The patient expressed understanding and agreed to proceed.   ASSESSMENT & PLAN:   Victoria Young is a 48 y.o. lady with a past medical history of hypertension, plantar fasciitis, sciatica, was referred to our service for evaluation after she was diagnosed with right lower extremity DVT on 08/01/2023.  Grossly negative thrombophilia workup so far.  Acute deep vein thrombosis (DVT) of right peroneal vein (HCC) First episode of DVT in the right peroneal vein, identified in July 2025.  No provoking factors such as immobility, surgery, or hormonal therapy.   Family history of pulmonary embolism in sister.  Currently on Eliquis  5 mg twice daily.    The clot is relatively small, located in the calf veins. No symptoms of chest pain or dyspnea. Slight swelling in the right leg persists but is not significant.    The condition is considered unprovoked, necessitating investigation for thrombophilia.  On her consultation with us  on 09/14/2023, we checked factor V Leiden mutation, prothrombin gene mutation, beta-2  glycoprotein antibodies, anticardiolipin antibodies and all of these were unremarkable.  D-dimer was undetectable.  Rest of the thrombophilia workup was  deferred as it can be falsely abnormal given recent thrombosis and also given concurrent Eliquis  use.   - Continue Eliquis  5 mg twice daily until at least the end of October.    - Schedule follow-up ultrasound in late October to assess clot resolution.   - Schedule follow-up appointment in November.  We will obtain remaining thrombophilia workup on return visit.  Insect bite of neck Reports a knot on the back of her neck, likely due to an insect bite, noticed on Sunday. The lesion was initially red but is decreasing in size. There is no pus, and she has been applying alcohol without irritation. A second knot on the side of the neck has also reduced in size. - Apply triple antibiotic cream such as Neosporin to the affected area - Monitor the lesion until Friday and seek further evaluation if it does not improve.   I discussed the assessment and treatment plan with the patient. The patient was provided an opportunity to ask questions and all were answered. The patient agreed with the plan and demonstrated an understanding of the instructions. The patient was advised to call back or seek an in-person evaluation if the symptoms worsen or if the condition fails to improve as anticipated.    I spent 12 minutes over the phone with the patient reviewing test results, discuss management and coordination/planning of care.  Chinita Patten, MD 09/28/2023 10:23 AM [facility] - A DEPT OF [facility] [address] [place] [address] Dept: [phone] Dept Fax: [phone]   INTERVAL HISTORY:  Please see above for problem oriented charting.  The purpose of today's discussion is to explain recent lab results and to formulate plan of care.  Discussed the use of AI scribe software for clinical note transcription with the patient, who gave verbal consent to proceed.  History of Present Illness Victoria Young is a 48 year old female  who presents for follow-up regarding blood clot management.  She is currently taking Eliquis  twice daily as a blood thinner. Recent blood work was performed, including blood counts, chemistries, and a D-dimer test. Genetic testing for clotting risk factors was performed for four mutations. There is a family history of blood clots, as her sister had a lung clot.  She has noticed a 'knot' on the back of her neck since Sunday, which is decreasing in size. She suspects it might be an insect bite. The area was red but not painful, and there was no pus. She has been applying alcohol to the area without experiencing any burning sensation. Another similar knot on the side of her neck has also reduced in size, but a small lump remains.    SUMMARY OF HEMATOLOGY HISTORY:  Patient presented to the ED on 08/01/2023 with complaints of right lower extremity pain for 3 days prior to arrival.  Ultrasound of the lower extremities showed findings consistent with acute DVT involving the right peroneal veins.  She was started on Eliquis .  DVT was seemingly unprovoked based on history.  Hence a referral was sent to us  for further evaluation and thrombophilia workup.   This is her first episode of DVT, and she has no personal history of blood clots. Her sister had a pulmonary embolism at a similar age.   She has been on Eliquis , initially at a higher dose of two tablets twice a day, now reduced to one tablet twice a day. She has only two pills left. She denies any recent surgeries, prolonged immobility, or use of hormonal birth control that could have contributed to the clot. She has an IUD and previously used medroxyprogesterone  in 2017.   She reports improvement in her right leg pain but experienced swelling in her right ankle and pain in her left hip over the weekend, which resolved spontaneously. No chest pain or difficulty breathing, except when exercising. She has a history of sciatica, with pain primarily in her hip  rather than her calf, which affects her ability to walk.   Her social history includes working as a Engineer, structural in group homes, which involves physical activity. She does not smoke and stays hydrated. She is up to date with her mammograms, having had one at age 63, and has no family history of colon cancer.   Negative Thrombotic Risk Factors: Previous VTE, Recent surgery (within 3 months), Recent trauma (within 3 months), Recent admission to hospital with acute illness (within 3 months), Paralysis, paresis, or recent plaster cast immobilization of lower extremity, Central venous catheterization, Bed rest >72 hours within 3 months, Sedentary journey lasting >8 hours within 4 weeks, Pregnancy, Within 6 weeks postpartum, Recent cesarean section (within 3 months), Estrogen therapy, Testosterone therapy, Erythropoiesis-stimulating agent, Recent COVID diagnosis (within 3 months), Active cancer, Non-malignant, chronic inflammatory condition, Known thrombophilic condition, Smoking, Older age     No provoking factors such as immobility, surgery, or hormonal therapy.    Family history of pulmonary embolism in sister.   Currently on Eliquis  5 mg twice daily.    The clot is relatively small, located in the calf veins. No symptoms of chest pain or dyspnea. Slight swelling in  the right leg persists but is not significant.    The condition is considered unprovoked, necessitating investigation for thrombophilia.  On her consultation with us  on 09/14/2023, we checked factor V Leiden mutation, prothrombin gene mutation, beta-2  glycoprotein antibodies, anticardiolipin antibodies and all of these were unremarkable.  D-dimer was undetectable.  Rest of the thrombophilia workup was deferred as it can be falsely abnormal given recent thrombosis and also given concurrent Eliquis  use.   - Continue Eliquis  5 mg twice daily until at least the end of October.    - Schedule follow-up ultrasound in late October to assess clot  resolution.   - Schedule follow-up appointment in November.  We will obtain remaining thrombophilia workup on return visit.  REVIEW OF SYSTEMS:    Review of Systems - Oncology  All other pertinent systems were reviewed with the patient and are negative.  I have reviewed the past medical history, past surgical history, social history and family history with the patient and they are unchanged from previous note.  ALLERGIES:  She has no known allergies.  MEDICATIONS:  Current Outpatient Medications  Medication Sig Dispense Refill   apixaban  (ELIQUIS ) 5 MG TABS tablet Take 1 tablet (5 mg total) by mouth 2 (two) times daily. 60 tablet 2   hydrochlorothiazide  (HYDRODIURIL ) 12.5 MG tablet Take 1 tablet (12.5 mg total) by mouth daily. 30 tablet 1   No current facility-administered medications for this visit.    PHYSICAL EXAMINATION:    Onc Performance Status - 09/28/23 0900       ECOG Perf Status   ECOG Perf Status Fully active, able to carry on all pre-disease performance without restriction      KPS SCALE   KPS % SCORE Normal, no compliants, no evidence of disease          LABORATORY DATA:   I have reviewed the data as listed.  Recent Results (from the past 2160 hours)  CBC with Differential     Status: None   Collection Time: 08/01/23  9:44 AM  Result Value Ref Range   WBC 4.8 4.0 - 10.5 K/uL   RBC 4.49 3.87 - 5.11 MIL/uL   Hemoglobin 12.9 12.0 - 15.0 g/dL   HCT 59.6 63.9 - 53.9 %   MCV 89.8 80.0 - 100.0 fL   MCH 28.7 26.0 - 34.0 pg   MCHC 32.0 30.0 - 36.0 g/dL   RDW 87.1 88.4 - 84.4 %   Platelets 248 150 - 400 K/uL   nRBC 0.0 0.0 - 0.2 %   Neutrophils Relative % 57 %   Neutro Abs 2.7 1.7 - 7.7 K/uL   Lymphocytes Relative 29 %   Lymphs Abs 1.4 0.7 - 4.0 K/uL   Monocytes Relative 13 %   Monocytes Absolute 0.6 0.1 - 1.0 K/uL   Eosinophils Relative 1 %   Eosinophils Absolute 0.1 0.0 - 0.5 K/uL   Basophils Relative 0 %   Basophils Absolute 0.0 0.0 - 0.1 K/uL    Immature Granulocytes 0 %   Abs Immature Granulocytes 0.02 0.00 - 0.07 K/uL    Comment: Performed at [facility], [address], [place], [place] [address]  Basic metabolic panel     Status: None   Collection Time: 08/01/23  9:44 AM  Result Value Ref Range   Sodium 137 135 - 145 mmol/L   Potassium 3.9 3.5 - 5.1 mmol/L   Chloride 104 98 - 111 mmol/L   CO2 23 22 - 32 mmol/L  Glucose, Bld 91 70 - 99 mg/dL    Comment: Glucose reference range applies only to samples taken after fasting for at least 8 hours.   BUN 10 6 - 20 mg/dL   Creatinine, Ser 9.26 0.44 - 1.00 mg/dL   Calcium 9.0 8.9 - 89.6 mg/dL   GFR, Estimated >39 >39 mL/min    Comment: (NOTE) Calculated using the CKD-EPI Creatinine Equation (2021)    Anion gap 10 5 - 15    Comment: Performed at [facility], [address], [place], [place] [address]  CBC with Differential (Cancer Center Only)     Status: Abnormal   Collection Time: 09/14/23  9:56 AM  Result Value Ref Range   WBC Count 4.1 4.0 - 10.5 K/uL   RBC 4.16 3.87 - 5.11 MIL/uL   Hemoglobin 11.9 (L) 12.0 - 15.0 g/dL   HCT 63.3 63.9 - 53.9 %   MCV 88.0 80.0 - 100.0 fL   MCH 28.6 26.0 - 34.0 pg   MCHC 32.5 30.0 - 36.0 g/dL   RDW 86.7 88.4 - 84.4 %   Platelet Count 280 150 - 400 K/uL   nRBC 0.0 0.0 - 0.2 %   Neutrophils Relative % 54 %   Neutro Abs 2.2 1.7 - 7.7 K/uL   Lymphocytes Relative 35 %   Lymphs Abs 1.5 0.7 - 4.0 K/uL   Monocytes Relative 10 %   Monocytes Absolute 0.4 0.1 - 1.0 K/uL   Eosinophils Relative 1 %   Eosinophils Absolute 0.0 0.0 - 0.5 K/uL   Basophils Relative 0 %   Basophils Absolute 0.0 0.0 - 0.1 K/uL   Immature Granulocytes 0 %   Abs Immature Granulocytes 0.01 0.00 - 0.07 K/uL    Comment: Performed at [facility], [address] [place], [place] [address]  CMP (Cancer Center only)     Status: Abnormal   Collection Time: 09/14/23  9:56 AM  Result Value Ref Range   Sodium 139 135 - 145 mmol/L    Potassium 4.1 3.5 - 5.1 mmol/L   Chloride 107 98 - 111 mmol/L   CO2 28 22 - 32 mmol/L   Glucose, Bld 86 70 - 99 mg/dL    Comment: Glucose reference range applies only to samples taken after fasting for at least 8 hours.   BUN 14 6 - 20 mg/dL   Creatinine 9.36 9.55 - 1.00 mg/dL   Calcium 9.1 8.9 - 89.6 mg/dL   Total Protein 7.5 6.5 - 8.1 g/dL   Albumin 3.8 3.5 - 5.0 g/dL   AST 23 15 - 41 U/L   ALT 20 0 - 44 U/L   Alkaline Phosphatase 35 (L) 38 - 126 U/L   Total Bilirubin 0.5 0.0 - 1.2 mg/dL   GFR, Estimated >39 >39 mL/min    Comment: (NOTE) Calculated using the CKD-EPI Creatinine Equation (2021)    Anion gap 4 (L) 5 - 15    Comment: Performed at [facility], [address], [place], [place] [address]  D-dimer, quantitative     Status: None   Collection Time: 09/14/23  9:56 AM  Result Value Ref Range   D-Dimer, Quant <0.27 0.00 - 0.50 ug/mL-FEU    Comment: (NOTE) At the manufacturer cut-off value of 0.5 g/mL FEU, this assay has a negative predictive value of 95-100%.This assay is intended for use in conjunction with a clinical pretest probability (PTP) assessment model to exclude pulmonary embolism (PE) and deep venous  thrombosis (DVT) in outpatients suspected of PE or DVT. Results should be correlated with clinical presentation. Performed at [facility], [address] [place], [place] [address]   Factor 5 leiden     Status: None   Collection Time: 09/14/23  9:56 AM  Result Value Ref Range   Recommendations-F5LEID: Comment     Comment: (NOTE) Result: c.1601G>A (p.Arg534Gln) - Not Detected This result is not associated with an increased risk for venous thromboembolism. See Additional Clinical Information and Comments. Additional Clinical Information:    Venous thromboembolism is a multifactorial disease influenced by genetic, environmental, and circumstantial risk factors. The c.1601G>A (p. Arg534Gln) variant in the F5 gene,  commonly referred to as Factor V Leiden, is a genetic risk factor for venous thromboembolism. Heterozygous carriers of this variant have a 6- to 8- fold increased risk for venous thromboembolism. Individuals homozygous for this variant (ie, with a copy of the variant on each chromosome) have an approximately 80-fold increased risk for venous thromboembolism. Individuals who carry both a c.*97G>A variant in the F2 gene and Factor V Leiden have an approximately 20-fold increased risk for venous thromboembolism. Risks are likely to be even higher in more complex genotype combinations  involving the F2 c.*97G>A variant and Factor V Leiden (PMID: 66325232). Additional risk factors include but are not limited to: deficiency of protein C, protein S, or antithrombin III, age, female sex, personal or family history of deep vein thromboembolism, smoking, surgery, prolonged immobilization, malignant neoplasm, tamoxifen treatment, raloxifene treatment, oral contraceptive use, hormone replacement therapy, and pregnancy. Management of thrombotic risk and thrombotic events should follow established guidelines and fit the clinical circumstance. This result cannot predict the occurrence or recurrence of a thrombotic event. Comment:    Genetic counseling is recommended to discuss the potential clinical implications of positive results, as well as recommendations for testing family members.    Genetic Coordinators are available for health care providers to discuss results at [phone]). Test Details:    Variant Analyzed: c.1601G>A (p. Arg534Gln), referre d to as Factor V Leiden Methods/Limitations:    DNA analysis of the F5 gene (NM_000130.5) was performed by PCR amplification followed by electrophoresis. The diagnostic sensitivity is >99%. Results must be combined with clinical information for the most accurate interpretation. Molecular-based testing is highly accurate, but as in any  laboratory test, diagnostic errors may occur. False positive or false negative results may occur for reasons that include genetic variants, blood transfusions, bone marrow transplantation, somatic or tissue-specific mosaicism, mislabeled samples, or erroneous representation of family relationships.    This test was developed and its performance characteristics determined by [facility]. It has not been cleared or approved by the Food and Drug Administration. References:    Bhatt S, Taylor AK, Lozano R, Grody [facility], Signa [facility]; ACMG Professional Practice and Guidelines Committee. Addendum: [employer] of Medical Genetics consensus statemen t on factor V Leiden mutation testing. Genet Med. 2021 Mar 5. doi: 89.8961/d58563-978- 01108-x. PMID: 66325232.    Hosey RUSH. Factor V Leiden Thrombophilia. 1999 May 14 (Updated 2018 Jan 4). In: Juliene POSNER, Ardinger HH, Pagon RA, et al., editors. GeneReviews(R) (Internet). [address] [facility]): [employer] , [place]; 8006-7978. Available from: [email or web address]-mcgee.org/    Laurita GORMAN Waddell BRIDGETT, Huang X, Luo B, Spector EB, Ileana SHAUNNA Gal CS; ACMG Laboratory Quality Assurance Committee. Venous thromboembolism laboratory testing (factor V Leiden and factor II c. *97G>A), 2018 update: a technical standard of the [employer] of [employer] and Genomics (ACMG). Genet Med. 2018 Dec;20(12):  8510-8501. doi: 10.1038/[id number]-z. Epub 2018 Oct 5. PMID: 69702301.    Reviewed By: Comment     Comment: (NOTE) Technical Component performed at [facility] Professional Component performed by: Fairy WENDI Aid, PhD, [facility] [id number], [facility], [address] [place] [address] Performed At: [facility] [address] [place], [place] [id number] Loran Gales MDPhD Ey:[phone]   Beta-2 -glycoprotein i abs, IgG/M/A     Status: None   Collection Time: 09/14/23  9:56 AM  Result Value Ref Range   Beta-2  Glyco I IgG <9 0 - 20 GPI  IgG units   Beta-2 -Glycoprotein I IgM <9 0 - 32 GPI IgM units    Comment: (NOTE) Performed At: [facility] [place] [address] [place], [place] [id number] Jennette Shorter MD Ey:[phone]    Beta-2 -Glycoprotein I IgA <9 0 - 25 GPI IgA units  Cardiolipin antibodies, IgG, IgM, IgA     Status: None   Collection Time: 09/14/23  9:56 AM  Result Value Ref Range   Anticardiolipin IgG <9 0 - 14 GPL U/mL    Comment: (NOTE)                          Negative:              <15                          Indeterminate:     15 - 20                          Low-Med Positive: >20 - 80                          High Positive:         >80    Anticardiolipin IgM <9 0 - 12 MPL U/mL    Comment: (NOTE)                          Negative:              <13                          Indeterminate:     13 - 20                          Low-Med Positive: >20 - 80                          High Positive:         >80    Anticardiolipin IgA <9 0 - 11 APL U/mL    Comment: (NOTE)                          Negative:              <12                          Indeterminate:     12 - 20                          Low-Med Positive: >20 - 80  High Positive:         >80 Performed At: [facility] [address] [place], [place] [id number] Jennette Shorter MD Ey:[phone]   Prothrombin gene mutation     Status: None   Collection Time: 09/14/23  9:56 AM  Result Value Ref Range   Recommendations-PTGENE: Comment     Comment: (NOTE) Result: c.*97G>A - Not Detected This result is not associated with an increased risk for venous thromboembolism. See Additional Clinical Information and Comments. Additional Clinical Information: Venous thromboembolism is a multifactorial disease influenced by genetic, environmental, and circumstantial risk factors. The c.*97G>A variant in the F2 gene is a genetic risk factor for venous thromboembolism. Heterozygous carriers have a 2- to 4-fold increased risk  for venous thromboembolism. Homozygotes for the c.*97G>A variant are rare. The annual risk of VTE in homozygotes has been reported to be 1.1%/year. Individuals who carry both a c.*97G>A variant in the F2 gene and a c.1601G>A (p. Arg534Gln) variant in the F5 gene (commonly referred to as Factor V Leiden) have an approximately 20- fold increased risk for venous thromboembolism. Risks are likely to be even higher in more complex genotype combinations involving the F2 c.*97G>A variant and Factor V Leiden (PMID:  66325232). Additional risk factors include but are not limited to: deficiency of protein C, protein S, or antithrombin III, age, female sex, personal or family history of deep vein thromboembolism, smoking, surgery, prolonged immobilization, malignant neoplasm, tamoxifen treatment, raloxifene treatment, oral contraceptive use, hormone replacement therapy, and pregnancy. Management of thrombotic risk and thrombotic events should follow established guidelines and fit the clinical circumstance. This result cannot predict the occurrence or recurrence of a thrombotic event. Comments: Genetic counseling is recommended to discuss the potential clinical implications of positive results, as well as recommendations for testing family members. Genetic Coordinators are available for health care providers to discuss results at [phone]). Test Details: Variant analyzed: c.*97G>A, previously referred to as G20210A Methods/Limitations: DNA analysis of the F2 gene (NM_000 506.5) was performed by PCR amplification followed by restriction enzyme analysis. The diagnostic sensitivity is >99%. Results must be combined with clinical information for the most accurate interpretation. Molecular-based testing is highly accurate, but as in any laboratory test, diagnostic errors may occur. False positive or false negative results may occur for reasons that include genetic variants, blood  transfusions, bone marrow transplantation, somatic or tissue-specific mosaicism, mislabeled samples, or erroneous representation of family relationships. This test was developed and its performance characteristics determined by [facility]. It has not been cleared or approved by the Food and Drug Administration. References: Bhatt S, Taylor AK, Lozano R, Grody [facility], [facility]; ACMG Professional Practice and Guidelines Committee. Addendum: [employer] of Medical Genetics consensus statement on factor V Leiden mutation testing. Genet Med. 2021 Mar 5. doi: 89.8961/d585 36-021-01108-x. PMID: 66325232. Hosey RUSH. Prothrombin Thrombophilia. 2006 Jul 25 [Updated 2021 Feb 4]. In: Juliene POSNER, Ardinger HH, Pagon RA, et al., editors. GeneReviews(R) [Internet]. [address] [facility]): [employer] , [place]; 8006-7978. Available from: [email or web address].[email or web address]/ Laurita GORMAN Waddell BRIDGETT, Huang X, Luo B, Spector EB, Ileana SHAUNNA Gal CS; ACMG Laboratory Quality Assurance Committee. Venous thromboembolism laboratory testing (factor V Leiden and factor II c.*97G>A), 2018 update: a technical standard of the [employer] of [employer] and Genomics (ACMG). Genet Med. 2018 Dec;20(12):1489-1498. doi: 10.1038/[id number]-z. Epub 2018 Oct 5. PMID: 69702301.    Reviewed by: Comment     Comment: (NOTE) Technical Component performed at [facility] Professional Component performed by: Melissa A. Elberta, PhD, FACMG [id number], [facility], [phone] TW  [address] [place] [address] Performed At: [employer] [place] [address] [place], [place] [id number] Loran Gales MDPhD Ey:[phone]      RADIOGRAPHIC STUDIES:  No recent pertinent imaging studies available to review.  Orders Placed This Encounter  Procedures   CBC with Differential (Cancer Center Only)    Standing Status:   Future    Expected Date:   11/17/2023    Expiration Date:   02/15/2024   D-dimer, quantitative    Standing Status:    Future    Expected Date:   11/17/2023    Expiration Date:   02/15/2024   Protein C activity    Standing Status:   Future    Expected Date:   11/17/2023    Expiration Date:   02/15/2024   PROTEIN S PANEL    Standing Status:   Future    Expected Date:   11/17/2023    Expiration Date:   02/15/2024   Antithrombin panel    Standing Status:   Future    Expected Date:   11/17/2023    Expiration Date:   02/15/2024   Lupus anticoagulant panel    Standing Status:   Future    Expected Date:   11/17/2023    Expiration Date:   02/15/2024     Future Appointments  Date Time Provider Department Center  11/08/2023  3:30 PM [facility] [id number]  11/17/2023  9:15 AM [facility] None  11/17/2023  9:45 AM Sianni Cloninger, Chinita, MD [facility] None    This document was completed utilizing speech recognition software. Grammatical errors, random word insertions, pronoun errors, and incomplete sentences are an occasional consequence of this system due to software limitations, ambient noise, and hardware issues. Any formal questions or concerns about the content, text or information contained within the body of this dictation should be directly addressed to the provider for clarification.

## 2023-10-03 ENCOUNTER — Encounter: Payer: Self-pay | Admitting: Oncology

## 2023-11-08 ENCOUNTER — Ambulatory Visit (HOSPITAL_BASED_OUTPATIENT_CLINIC_OR_DEPARTMENT_OTHER)

## 2023-11-08 DIAGNOSIS — I82451 Acute embolism and thrombosis of right peroneal vein: Secondary | ICD-10-CM

## 2023-11-09 ENCOUNTER — Encounter (HOSPITAL_BASED_OUTPATIENT_CLINIC_OR_DEPARTMENT_OTHER)

## 2023-11-17 ENCOUNTER — Inpatient Hospital Stay (HOSPITAL_BASED_OUTPATIENT_CLINIC_OR_DEPARTMENT_OTHER): Admitting: Oncology

## 2023-11-17 ENCOUNTER — Inpatient Hospital Stay: Attending: Oncology

## 2023-11-17 VITALS — BP 163/110 | HR 66 | Temp 97.5°F | Resp 17 | Ht 65.0 in | Wt 179.1 lb

## 2023-11-17 DIAGNOSIS — Z79899 Other long term (current) drug therapy: Secondary | ICD-10-CM | POA: Insufficient documentation

## 2023-11-17 DIAGNOSIS — Z86718 Personal history of other venous thrombosis and embolism: Secondary | ICD-10-CM

## 2023-11-17 DIAGNOSIS — Z8249 Family history of ischemic heart disease and other diseases of the circulatory system: Secondary | ICD-10-CM | POA: Diagnosis not present

## 2023-11-17 DIAGNOSIS — I10 Essential (primary) hypertension: Secondary | ICD-10-CM | POA: Insufficient documentation

## 2023-11-17 DIAGNOSIS — Z0000 Encounter for general adult medical examination without abnormal findings: Secondary | ICD-10-CM | POA: Diagnosis not present

## 2023-11-17 DIAGNOSIS — Z1211 Encounter for screening for malignant neoplasm of colon: Secondary | ICD-10-CM | POA: Diagnosis not present

## 2023-11-17 DIAGNOSIS — I82451 Acute embolism and thrombosis of right peroneal vein: Secondary | ICD-10-CM

## 2023-11-17 DIAGNOSIS — Z7901 Long term (current) use of anticoagulants: Secondary | ICD-10-CM | POA: Insufficient documentation

## 2023-11-17 LAB — CBC WITH DIFFERENTIAL (CANCER CENTER ONLY)
Abs Immature Granulocytes: 0.01 K/uL (ref 0.00–0.07)
Basophils Absolute: 0 K/uL (ref 0.0–0.1)
Basophils Relative: 1 %
Eosinophils Absolute: 0.1 K/uL (ref 0.0–0.5)
Eosinophils Relative: 2 %
HCT: 40.3 % (ref 36.0–46.0)
Hemoglobin: 13.2 g/dL (ref 12.0–15.0)
Immature Granulocytes: 0 %
Lymphocytes Relative: 44 %
Lymphs Abs: 1.5 K/uL (ref 0.7–4.0)
MCH: 28.7 pg (ref 26.0–34.0)
MCHC: 32.8 g/dL (ref 30.0–36.0)
MCV: 87.6 fL (ref 80.0–100.0)
Monocytes Absolute: 0.4 K/uL (ref 0.1–1.0)
Monocytes Relative: 12 %
Neutro Abs: 1.4 K/uL — ABNORMAL LOW (ref 1.7–7.7)
Neutrophils Relative %: 41 %
Platelet Count: 290 K/uL (ref 150–400)
RBC: 4.6 MIL/uL (ref 3.87–5.11)
RDW: 13.1 % (ref 11.5–15.5)
WBC Count: 3.4 K/uL — ABNORMAL LOW (ref 4.0–10.5)
nRBC: 0 % (ref 0.0–0.2)

## 2023-11-17 LAB — D-DIMER, QUANTITATIVE: D-Dimer, Quant: <0.27 ug{FEU}/mL (ref 0.00–0.50)

## 2023-11-17 MED ORDER — HYDROCHLOROTHIAZIDE 12.5 MG PO TABS: 12.5000 mg | ORAL_TABLET | Freq: Every day | ORAL | 1 refills | Status: AC

## 2023-11-17 NOTE — Assessment & Plan Note (Addendum)
 Discussed importance of age-appropriate screenings to rule out cancer as a potential cause of previous clot. - Sent referral for colonoscopy. - Advised follow-up with primary care provider for mammogram scheduling, depending on when she is due

## 2023-11-17 NOTE — Progress Notes (Signed)
 [facility]  HEMATOLOGY CLINIC PROGRESS NOTE  PATIENT NAME: Victoria Young   MR#: [id number] DOB: 1975/04/10  Patient Care Team: Redmon, Noelle, PA as PCP - General (Physician Assistant)  Date of visit: 11/17/2023   ASSESSMENT & PLAN:   Victoria Young is a 48 y.o. lady with a past medical history of hypertension, plantar fasciitis, sciatica, was referred to our service for evaluation after she was diagnosed with right lower extremity DVT on 08/01/2023.  Grossly negative thrombophilia workup so far.    History of deep venous thrombosis (DVT) of distal vein of right lower extremity First episode of DVT in the right peroneal vein, identified in July 2025.  No provoking factors such as immobility, surgery, or hormonal therapy.   Family history of pulmonary embolism in sister.  She was on Eliquis  5 mg twice daily and completed 65-month course.   The clot is relatively small, located in the calf veins. No symptoms of chest pain or dyspnea. Slight swelling in the right leg persists but is not significant.    The condition is considered unprovoked, necessitating investigation for thrombophilia.  On her consultation with us  on 09/14/2023, we checked factor V Leiden mutation, prothrombin gene mutation, beta-2  glycoprotein antibodies, anticardiolipin antibodies and all of these were unremarkable.  D-dimer was undetectable.  Rest of the thrombophilia workup was deferred as it can be falsely abnormal given recent thrombosis and also given concurrent Eliquis  use.   On her follow-up visit with us  on 11/17/2023, we pursued remaining hypercoagulable workup including protein C activity, protein S activity, Antithrombin III activity, lupus anticoagulant.  It was all negative.  D-dimer was undetectable.  Repeat ultrasound of the right lower extremity on 11/08/2023 showed no evidence of DVT.  Since it was a small clot which resolved with appropriate anticoagulation and there was no evidence of  thrombophilia state, discussed switching to aspirin 81 mg daily for clot prevention and discontinuing Eliquis  and patient was agreeable.  Made this change from 11/17/2023.  She completed a total of 3 months of anticoagulation with Eliquis .  RTC in 6 months for routine follow-up.  If no acute issues, she can be discharged from our office after next visit.  Health care maintenance  Discussed importance of age-appropriate screenings to rule out cancer as a potential cause of previous clot. - Sent referral for colonoscopy. - Advised follow-up with primary care provider for mammogram scheduling, depending on when she is due  Essential hypertension Elevated blood pressure with non-adherence to hydrochlorothiazide  due to lack of medication. - Sent prescription refill for hydrochlorothiazide  to [facility] on [address]. - Advised follow-up with primary care provider for blood pressure management.   I spent a total of 30 minutes during this encounter with the patient including review of chart and various tests results, discussions about plan of care and coordination of care plan.  I reviewed lab results and outside records for this visit and discussed relevant results with the patient. Diagnosis, plan of care and treatment options were also discussed in detail with the patient. Opportunity provided to ask questions and answers provided to her apparent satisfaction. Provided instructions to call our clinic with any problems, questions or concerns prior to return visit. I recommended to continue follow-up with PCP and sub-specialists. She verbalized understanding and agreed with the plan. No barriers to learning was detected.  Chinita Patten, MD  11/17/2023 11:10 AM  [facility] - A DEPT OF [place].  [facility] [address] [place] [address] Dept: [phone] Dept Fax: [phone]   CHIEF COMPLAINT/ REASON FOR VISIT:  Follow-up for  history of right lower extremity DVT, diagnosed in July 2025, seemingly unprovoked.  Grossly negative thrombophilia workup so far.  INTERVAL HISTORY:  Discussed the use of AI scribe software for clinical note transcription with the patient, who gave verbal consent to proceed.  History of Present Illness Victoria Young is a 48 year old female who presents for follow-up after completing blood thinner therapy for a blood clot.  In July, she was diagnosed with a blood clot confirmed by ultrasound and was placed on blood thinners, which she completed last Sunday. A follow-up ultrasound last week showed no evidence of the clot. Previous blood tests, including a D-dimer test, were normal. Additional blood tests were conducted today to complete the workup.  She experiences occasional swelling in the leg where the clot was located, describing it as feeling 'bigger' than the other leg. She also has cramping pain in the affected leg, which she attributes to her workouts. She does not wear compression socks. 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  No chest pain, trouble breathing, blood in stools, or black stools. She does not take aspirin or baby aspirin, as she does not like taking pills, but she tries to stay hydrated and consumes protein-rich foods.  She has run out of her hydrochlorothiazide  medication.    SUMMARY OF HEMATOLOGIC HISTORY:  Patient presented to the ED on 08/01/2023 with complaints of right lower extremity pain for 3 days prior to arrival.  Ultrasound of the lower extremities showed findings consistent with acute DVT involving the right peroneal veins.  She was started on Eliquis .  DVT was seemingly unprovoked based on history.  Hence a referral was sent to us  for further evaluation and thrombophilia workup.   This is her first episode of DVT, and she has no personal history of blood clots. Her sister had a pulmonary  embolism at a similar age.   She has been on Eliquis , initially at a higher dose of two tablets twice a day, now reduced to one tablet twice a day. She has only two pills left. She denies any recent surgeries, prolonged immobility, or use of hormonal birth control that could have contributed to the clot. She has an IUD and previously used medroxyprogesterone  in 2017.   She reports improvement in her right leg pain but experienced swelling in her right ankle and pain in her left hip over the weekend, which resolved spontaneously. No chest pain or difficulty breathing, except when exercising. She has a history of sciatica, with pain primarily in her hip rather than her calf, which affects her ability to walk.   Her social history includes working as a engineer, structural in group homes, which involves physical activity. She does not smoke and stays hydrated. She is up to date with her mammograms, having had one at age 12, and has no family history of colon cancer.   Negative Thrombotic Risk Factors: Previous VTE, Recent surgery (within 3 months), Recent trauma (within 3 months), Recent admission to hospital with acute illness (within 3 months), Paralysis, paresis, or recent plaster cast immobilization of lower extremity, Central venous catheterization, Bed rest >72 hours within 3 months, Sedentary journey lasting >8 hours within 4 weeks, Pregnancy, Within 6 weeks postpartum, Recent cesarean section (within 3 months), Estrogen therapy, Testosterone therapy, Erythropoiesis-stimulating agent, Recent COVID  diagnosis (within 3 months), Active cancer, Non-malignant, chronic inflammatory condition, Known thrombophilic condition, Smoking, Older age     No provoking factors such as immobility, surgery, or hormonal therapy.    Family history of pulmonary embolism in sister.   Currently on Eliquis  5 mg twice daily.    The clot is relatively small, located in the calf veins. No symptoms of chest pain or dyspnea. Slight  swelling in the right leg persists but is not significant.    The condition is considered unprovoked, necessitating investigation for thrombophilia.  On her consultation with us  on 09/14/2023, we checked factor V Leiden mutation, prothrombin gene mutation, beta-2  glycoprotein antibodies, anticardiolipin antibodies and all of these were unremarkable.  D-dimer was undetectable.  Rest of the thrombophilia workup was deferred as it can be falsely abnormal given recent thrombosis and also given concurrent Eliquis  use.  On her follow-up visit with us  on 11/17/2023, we pursued remaining hypercoagulable workup including protein C activity, protein S activity, Antithrombin III activity, lupus anticoagulant.  It was all negative.  D-dimer was undetectable.  Repeat ultrasound of the right lower extremity on 11/08/2023 showed no evidence of DVT.  Since it was a small clot which resolved with appropriate anticoagulation and there was no evidence of thrombophilia state, discussed switching to aspirin 81 mg daily for clot prevention and discontinuing Eliquis  and patient was agreeable.  Made this change from 11/17/2023.  I have reviewed the past medical history, past surgical history, social history and family history with the patient and they are unchanged from previous note.  ALLERGIES: She is allergic to losartan potassium.  MEDICATIONS:  Current Outpatient Medications  Medication Sig Dispense Refill   apixaban  (ELIQUIS ) 5 MG TABS tablet Take 1 tablet (5 mg total) by mouth 2 (two) times daily. 60 tablet 2   hydrochlorothiazide  (HYDRODIURIL ) 12.5 MG tablet Take 1 tablet (12.5 mg total) by mouth daily. (Patient taking differently: Take 12.5 mg by mouth daily. Not taking regularly) 30 tablet 1   No current facility-administered medications for this visit.     REVIEW OF SYSTEMS:    Review of Systems - Oncology  All other pertinent systems were reviewed with the patient and are negative.  PHYSICAL  EXAMINATION:    Onc Performance Status - 11/17/23 1055       ECOG Perf Status   ECOG Perf Status Fully active, able to carry on all pre-disease performance without restriction      KPS SCALE   KPS % SCORE Normal, no compliants, no evidence of disease          Vitals:   11/17/23 1050 11/17/23 1053  BP: (!) 159/69 (!) 163/110  Pulse: 66   Resp: 17   Temp: (!) 97.5 F (36.4 C)   SpO2: 100%    Filed Weights   11/17/23 1050  Weight: 179 lb 1.6 oz (81.2 kg)    Physical Exam Constitutional:      General: She is not in acute distress.    Appearance: Normal appearance.  HENT:     Head: Normocephalic and atraumatic.  Cardiovascular:     Rate and Rhythm: Normal rate.  Pulmonary:     Effort: Pulmonary effort is normal. No respiratory distress.  Abdominal:     General: There is no distension.  Neurological:     General: No focal deficit present.     Mental Status: She is alert and oriented to person, place, and time.  Psychiatric:        Mood and  Affect: Mood normal.        Behavior: Behavior normal.     LABORATORY DATA:   I have reviewed the data as listed.  Results for orders placed or performed in visit on 11/17/23  CBC with Differential (Cancer Center Only)  Result Value Ref Range   WBC Count 3.4 (L) 4.0 - 10.5 K/uL   RBC 4.60 3.87 - 5.11 MIL/uL   Hemoglobin 13.2 12.0 - 15.0 g/dL   HCT 59.6 63.9 - 53.9 %   MCV 87.6 80.0 - 100.0 fL   MCH 28.7 26.0 - 34.0 pg   MCHC 32.8 30.0 - 36.0 g/dL   RDW 86.8 88.4 - 84.4 %   Platelet Count 290 150 - 400 K/uL   nRBC 0.0 0.0 - 0.2 %   Neutrophils Relative % 41 %   Neutro Abs 1.4 (L) 1.7 - 7.7 K/uL   Lymphocytes Relative 44 %   Lymphs Abs 1.5 0.7 - 4.0 K/uL   Monocytes Relative 12 %   Monocytes Absolute 0.4 0.1 - 1.0 K/uL   Eosinophils Relative 2 %   Eosinophils Absolute 0.1 0.0 - 0.5 K/uL   Basophils Relative 1 %   Basophils Absolute 0.0 0.0 - 0.1 K/uL   Immature Granulocytes 0 %   Abs Immature Granulocytes 0.01  0.00 - 0.07 K/uL  D-dimer, quantitative  Result Value Ref Range   D-Dimer, Quant <0.27 0.00 - 0.50 ug/mL-FEU     RADIOGRAPHIC STUDIES:  I have personally reviewed the radiological images as listed and agree with the findings in the report.  VAS US  LOWER EXTREMITY VENOUS (DVT) Result Date: 11/08/2023  Lower Venous DVT Study Patient Name:  Victoria Young  Date of Exam:   11/08/2023 Medical Rec #: [id number]     Accession #:    [id number] Date of Birth: [birth year]     Patient Gender: F Patient Age:   25 years Exam Location:  [facility] Procedure:      VAS US  LOWER EXTREMITY VENOUS (DVT) Referring Phys: CHINITA Lin Glazier --------------------------------------------------------------------------------  Indications: DVT. Other Indications: Patient reports swelling and pain has improved. She does                    experience intermittent right calf tightness some morinings                    that resolves quickly. Patient denies any shortness of breath                    or chest pain. Anticoagulation: Eliquis . Comparison Study: 08/01/2023- Acute deep vein thrombosis involving the right                   peroneal veins. Performing Technologist: Orvil Holmes RDCS  Examination Guidelines: A complete evaluation includes B-mode imaging, spectral Doppler, color Doppler, and power Doppler as needed of all accessible portions of each vessel. Bilateral testing is considered an integral part of a complete examination. Limited examinations for reoccurring indications may be performed as noted. The reflux portion of the exam is performed with the patient in reverse Trendelenburg.  +---------+---------------+---------+-----------+----------+--------------+ RIGHT    CompressibilityPhasicitySpontaneityPropertiesThrombus Aging +---------+---------------+---------+-----------+----------+--------------+ CFV      Full           Yes      Yes                                  +---------+---------------+---------+-----------+----------+--------------+  SFJ      Full           Yes      Yes                                 +---------+---------------+---------+-----------+----------+--------------+ FV Prox  Full           Yes      Yes                                 +---------+---------------+---------+-----------+----------+--------------+ FV Mid   Full           Yes      Yes                                 +---------+---------------+---------+-----------+----------+--------------+ FV DistalFull           Yes      Yes                                 +---------+---------------+---------+-----------+----------+--------------+ PFV      Full                                                        +---------+---------------+---------+-----------+----------+--------------+ POP      Full           Yes      Yes                                 +---------+---------------+---------+-----------+----------+--------------+ PTV      Full           Yes      Yes                                 +---------+---------------+---------+-----------+----------+--------------+ PERO     Full           Yes      Yes                                 +---------+---------------+---------+-----------+----------+--------------+ Gastroc  Full           Yes      Yes                                 +---------+---------------+---------+-----------+----------+--------------+ GSV      Full           Yes      Yes                                 +---------+---------------+---------+-----------+----------+--------------+   +----+---------------+---------+-----------+----------+--------------+ LEFTCompressibilityPhasicitySpontaneityPropertiesThrombus Aging +----+---------------+---------+-----------+----------+--------------+ CFV Full           Yes      Yes                                  +----+---------------+---------+-----------+----------+--------------+  Findings reported to Dr. Autumn at 4:04.  Summary: RIGHT: - No evidence of deep vein thrombosis in the lower extremity. No indirect evidence of obstruction proximal to the inguinal ligament.  - No cystic structure found in the popliteal fossa.  LEFT: - No evidence of common femoral vein obstruction.   *See table(s) above for measurements and observations. Electronically signed by Debby Robertson on 11/08/2023 at 4:54:16 PM.    Final     Orders Placed This Encounter  Procedures   CBC with Differential (Cancer Center Only)    Standing Status:   Future    Expected Date:   05/16/2024    Expiration Date:   08/14/2024   D-dimer, quantitative    Standing Status:   Future    Expected Date:   05/16/2024    Expiration Date:   08/14/2024   Ambulatory referral to Gastroenterology    Referral Priority:   Routine    Referral Type:   Consultation    Referral Reason:   Specialty Services Required    Referred to Provider:   Charlanne Groom, MD    Number of Visits Requested:   1     Future Appointments  Date Time Provider Department Center  05/16/2024  9:15 AM [facility] None  05/16/2024  9:45 AM Uriyah Massimo, Chinita, MD [facility] None    This document was completed utilizing speech recognition software. Grammatical errors, random word insertions, pronoun errors, and incomplete sentences are an occasional consequence of this system due to software limitations, ambient noise, and hardware issues. Any formal questions or concerns about the content, text or information contained within the body of this dictation should be directly addressed to the provider for clarification.

## 2023-11-17 NOTE — Assessment & Plan Note (Addendum)
 First episode of DVT in the right peroneal vein, identified in July 2025.  No provoking factors such as immobility, surgery, or hormonal therapy.   Family history of pulmonary embolism in sister.  She was on Eliquis  5 mg twice daily and completed 93-month course.   The clot is relatively small, located in the calf veins. No symptoms of chest pain or dyspnea. Slight swelling in the right leg persists but is not significant.    The condition is considered unprovoked, necessitating investigation for thrombophilia.  On her consultation with us  on 09/14/2023, we checked factor V Leiden mutation, prothrombin gene mutation, beta-2  glycoprotein antibodies, anticardiolipin antibodies and all of these were unremarkable.  D-dimer was undetectable.  Rest of the thrombophilia workup was deferred as it can be falsely abnormal given recent thrombosis and also given concurrent Eliquis  use.   On her follow-up visit with us  on 11/17/2023, we pursued remaining hypercoagulable workup including protein C activity, protein S activity, Antithrombin III activity, lupus anticoagulant.  It was all negative.  D-dimer was undetectable.  Repeat ultrasound of the right lower extremity on 11/08/2023 showed no evidence of DVT.  Since it was a small clot which resolved with appropriate anticoagulation and there was no evidence of thrombophilia state, discussed switching to aspirin 81 mg daily for clot prevention and discontinuing Eliquis  and patient was agreeable.  Made this change from 11/17/2023.  She completed a total of 3 months of anticoagulation with Eliquis .  RTC in 6 months for routine follow-up.  If no acute issues, she can be discharged from our office after next visit.

## 2023-11-19 LAB — PROTEIN C ACTIVITY: Protein C Activity: 108 % (ref 73–180)

## 2023-11-19 LAB — ANTITHROMBIN PANEL
AT III AG PPP IMM-ACNC: 62 % — ABNORMAL LOW (ref 72–124)
Antithrombin Activity: 101 % (ref 75–135)

## 2023-11-19 LAB — PROTEIN S PANEL
Protein S Activity: 86 % (ref 63–140)
Protein S Ag, Free: 103 % (ref 61–136)
Protein S Ag, Total: 86 % (ref 60–150)

## 2023-11-20 LAB — LUPUS ANTICOAGULANT PANEL
DRVVT: 33.1 s (ref 0.0–47.0)
PTT Lupus Anticoagulant: 29.1 s (ref 0.0–43.5)

## 2023-12-05 ENCOUNTER — Encounter: Payer: Self-pay | Admitting: Oncology

## 2023-12-05 NOTE — Assessment & Plan Note (Signed)
 Elevated blood pressure with non-adherence to hydrochlorothiazide  due to lack of medication. - Sent prescription refill for hydrochlorothiazide  to [facility] on [address]. - Advised follow-up with primary care provider for blood pressure management.

## 2024-05-16 ENCOUNTER — Inpatient Hospital Stay

## 2024-05-16 ENCOUNTER — Inpatient Hospital Stay: Admitting: Oncology
